# Patient Record
Sex: Male | Born: 1990 | Race: Black or African American | Hispanic: No | Marital: Single | Smoking: Never smoker
Health system: Southern US, Community
[De-identification: ages and names within clinical notes are randomized; demographics above are authoritative.]

## PROBLEM LIST (undated history)

## (undated) DIAGNOSIS — F32A Depression, unspecified: Secondary | ICD-10-CM

## (undated) DIAGNOSIS — F419 Anxiety disorder, unspecified: Secondary | ICD-10-CM

## (undated) DIAGNOSIS — F4310 Post-traumatic stress disorder, unspecified: Secondary | ICD-10-CM

## (undated) HISTORY — PX: NO PAST SURGERIES: SHX2092

---

## 2020-01-15 ENCOUNTER — Encounter (HOSPITAL_COMMUNITY): Payer: Self-pay | Admitting: *Deleted

## 2020-01-15 ENCOUNTER — Other Ambulatory Visit: Payer: Self-pay

## 2020-01-15 ENCOUNTER — Emergency Department (HOSPITAL_COMMUNITY)
Admission: EM | Admit: 2020-01-15 | Discharge: 2020-01-15 | Disposition: A | Discharge status: Home / Self Care | Payer: Medicaid - Out of State | Attending: Emergency Medicine | Admitting: Emergency Medicine

## 2020-01-15 DIAGNOSIS — Z79899 Other long term (current) drug therapy: Secondary | ICD-10-CM | POA: Diagnosis not present

## 2020-01-15 DIAGNOSIS — Z760 Encounter for issue of repeat prescription: Secondary | ICD-10-CM | POA: Diagnosis present

## 2020-01-15 HISTORY — DX: Anxiety disorder, unspecified: F41.9

## 2020-01-15 HISTORY — DX: Depression, unspecified: F32.A

## 2020-01-15 HISTORY — DX: Post-traumatic stress disorder, unspecified: F43.10

## 2020-01-15 MED ORDER — TRAZODONE HCL 50 MG PO TABS
50.0000 mg | ORAL_TABLET | Freq: Two times a day (BID) | ORAL | 0 refills | Status: DC
Start: 2020-01-15 — End: 2020-03-17

## 2020-01-15 MED ORDER — CLONAZEPAM 1 MG PO TABS
1.0000 mg | ORAL_TABLET | Freq: Every day | ORAL | 0 refills | Status: DC
Start: 2020-01-15 — End: 2020-01-15

## 2020-01-15 MED ORDER — CLONAZEPAM 1 MG PO TABS
1.0000 mg | ORAL_TABLET | Freq: Every day | ORAL | 0 refills | Status: DC
Start: 2020-01-15 — End: 2020-03-17

## 2020-01-15 MED ORDER — OLANZAPINE 15 MG PO TABS
15.0000 mg | ORAL_TABLET | Freq: Every day | ORAL | 0 refills | Status: DC
Start: 2020-01-15 — End: 2020-03-17

## 2020-01-15 MED ORDER — LITHIUM CARBONATE 300 MG PO TABS: 300.0000 mg | ORAL_TABLET | Freq: Four times a day (QID) | ORAL | 0 refills | Status: DC

## 2020-01-15 MED ORDER — ESCITALOPRAM OXALATE 10 MG PO TABS
10.0000 mg | ORAL_TABLET | Freq: Every day | ORAL | 0 refills | Status: DC
Start: 2020-01-15 — End: 2020-03-17

## 2020-01-15 NOTE — ED Triage Notes (Signed)
Pt is traveling and ran out of his prescriptions and requesting a refill. Denies any complaints.

## 2020-01-15 NOTE — ED Provider Notes (Signed)
[facility] EMERGENCY DEPARTMENT Provider Note   CSN: [id number] Arrival date & time: 01/15/20  1637     History Chief Complaint  Patient presents with   Medication Refill    Jose Stevenson is a 29 y.o. male.  HPI    Patient presents to the emergency department with chief complaint of medication refill.  Patient states he recently moved from [place] and has not a PCP or behavioral health doctor to prescribe his medications.  Patient states he has been out of medication for the last week and is coming here today to get refills on his medication.  Patient denies any complaints, denies thoughts of suicide or hurting others.  Patient has a medical history of anxiety, depression, PTSD and is requesting med refills for these chronic diseases.  Patient denies alcohol use, smoking tobacco or marijuana, drug use.  He denies headache, fever, chills, shortness of breath, chest pain, abdominal pain, leg pain.  Past Medical History:  Diagnosis Date   Anxiety    Depression    PTSD (post-traumatic stress disorder)     There are no problems to display for this patient.   History reviewed. No pertinent surgical history.     History reviewed. No pertinent family history.  Social History   Tobacco Use   Smoking status: Never Smoker  Substance Use Topics   Alcohol use: Never   Drug use: Not on file    Home Medications Prior to Admission medications   Medication Sig Start Date End Date Taking? Authorizing Provider  clonazePAM (KLONOPIN) 1 MG tablet Take 1 mg by mouth daily.   Yes [provider]  escitalopram (LEXAPRO) 10 MG tablet Take 10 mg by mouth daily.   Yes [provider]  lithium 300 MG tablet Take 300 mg by mouth 4 (four) times daily.   Yes [provider]  OLANZapine (ZYPREXA) 15 MG tablet Take 15 mg by mouth at bedtime.   Yes [provider]  traZODone (DESYREL) 50 MG tablet Take 50 mg by mouth 2 (two) times  daily.   Yes [provider]  clonazePAM (KLONOPIN) 1 MG tablet Take 1 tablet (1 mg total) by mouth daily. 01/15/20   Marcello Fennel, PA-C  escitalopram (LEXAPRO) 10 MG tablet Take 1 tablet (10 mg total) by mouth daily. 01/15/20   Marcello Fennel, PA-C  lithium 300 MG tablet Take 1 tablet (300 mg total) by mouth 4 (four) times daily. 01/15/20 02/14/20  Marcello Fennel, PA-C  OLANZapine (ZYPREXA) 15 MG tablet Take 1 tablet (15 mg total) by mouth at bedtime. 01/15/20   Marcello Fennel, PA-C  traZODone (DESYREL) 50 MG tablet Take 1 tablet (50 mg total) by mouth 2 (two) times daily. 01/15/20 02/14/20  Marcello Fennel, PA-C    Allergies    Patient has no known allergies.  Review of Systems   Review of Systems  Constitutional: Negative for chills and fever.  HENT: Negative for congestion.   Respiratory: Negative for shortness of breath.   Cardiovascular: Negative for chest pain.  Gastrointestinal: Negative for abdominal pain.  Genitourinary: Negative for enuresis.  Musculoskeletal: Negative for back pain.  Skin: Negative for rash.  Neurological: Negative for dizziness and headaches.  Hematological: Does not bruise/bleed easily.  Psychiatric/Behavioral: Negative for agitation, behavioral problems, hallucinations, self-injury and suicidal ideas. The patient is not nervous/anxious.     Physical Exam Updated Vital Signs BP (!) 141/80 (BP Location: Left Arm)    Pulse 66  Temp 98.4 F (36.9 C) (Oral)    Resp 16    Ht 6\' 1"  (1.854 m)    Wt 86.2 kg    SpO2 100%    BMI 25.07 kg/m   Physical Exam Vitals and nursing note reviewed.  Constitutional:      General: He is not in acute distress.    Appearance: Normal appearance. He is not ill-appearing or diaphoretic.  HENT:     Head: Normocephalic and atraumatic.     Nose: No congestion or rhinorrhea.  Eyes:     General: No scleral icterus.       Right eye: No discharge.        Left eye: No discharge.      Conjunctiva/sclera: Conjunctivae normal.  Cardiovascular:     Rate and Rhythm: Normal rate and regular rhythm.     Pulses: Normal pulses.     Heart sounds: No murmur heard.  No friction rub. No gallop.   Pulmonary:     Effort: Pulmonary effort is normal. No respiratory distress.     Breath sounds: Normal breath sounds. No wheezing, rhonchi or rales.  Abdominal:     General: There is no distension.     Tenderness: There is no abdominal tenderness. There is no guarding.  Musculoskeletal:        General: No swelling.     Cervical back: Neck supple.     Right lower leg: No edema.     Left lower leg: No edema.  Skin:    General: Skin is warm and dry.     Coloration: Skin is not jaundiced or pale.     Findings: No rash.  Neurological:     Mental Status: He is alert and oriented to person, place, and time.  Psychiatric:        Mood and Affect: Mood normal.        Behavior: Behavior normal.     ED Results / Procedures / Treatments   Labs (all labs ordered are listed, but only abnormal results are displayed) Labs Reviewed - No data to display  EKG None  Radiology No results found.  Procedures Procedures (including critical care time)  Medications Ordered in ED Medications - No data to display  ED Course  I have reviewed the triage vital signs and the nursing notes.  Pertinent labs & imaging results that were available during my care of the patient were reviewed by me and considered in my medical decision making (see chart for details).    MDM Rules/Calculators/A&P                          I have personally reviewed all imaging, labs and have interpreted them.  Lab work and imaging will be deferred as vital signs are reassuring, physical exam did not show significant findings.  Patient denies having any complaints at this time, no thoughts of hurting himself or others.  Patient not show any signs of withdrawal, he was not anxious, he was not shaking or having pressured  speech, nontachycardic, afebrile, skin exam did not show  diaphoresis or clammy skin.  Patient appears to be resting comfortably, showing no signs of distress.  Vital signs are reassuring, patient does not meet criteria to be admitted to the hospital.  Patient will get a refill of his medications and given information to find outpatient behavioral health counseling as well as a PCP.  Patient was discussed with attending who agrees assessment  and plan.  Patient was explained results and plan, patient verbalized understanding agreement said plan. Final Clinical Impression(s) / ED Diagnoses Final diagnoses:  Medication refill    Rx / DC Orders ED Discharge Orders         Ordered    traZODone (DESYREL) 50 MG tablet  2 times daily     Discontinue  Reprint     01/15/20 1719    lithium 300 MG tablet  4 times daily     Discontinue  Reprint     01/15/20 1719    escitalopram (LEXAPRO) 10 MG tablet  Daily     Discontinue  Reprint     01/15/20 1719    clonazePAM (KLONOPIN) 1 MG tablet  Daily,   Status:  Discontinued     Reprint     01/15/20 1719    OLANZapine (ZYPREXA) 15 MG tablet  Daily at bedtime     Discontinue  Reprint     01/15/20 1719    clonazePAM (KLONOPIN) 1 MG tablet  Daily     Discontinue  Reprint     01/15/20 1721           Carroll Sage, PA-C 01/15/20 1803    Pricilla Loveless, MD 01/20/20 1035

## 2020-01-15 NOTE — Discharge Instructions (Signed)
You were seen here for a med refill.  I have given you a 30-day supply of all your medications except for your Klonopin I have only given you 10 days for that.    I have given you contact information for community health and wellness they can help provide you with a primary care provider with little to no insurance.  I have also given you resources to find outpatient counseling who can help manage your medications.  Please call them at your earliest convenience to schedule an appointment.  I want to come back to the emergency department if you develop shortness of breath, chest pain, uncontrolled nausea, vomiting, diarrhea, thoughts of hurting yourself or others as the symptoms require further evaluation and management.

## 2020-03-13 ENCOUNTER — Other Ambulatory Visit: Payer: Self-pay

## 2020-03-13 ENCOUNTER — Ambulatory Visit (HOSPITAL_COMMUNITY)
Admission: EM | Admit: 2020-03-13 | Discharge: 2020-03-13 | Disposition: A | Discharge status: Other Acute Inpatient Hospital | Payer: No Payment, Other | Attending: Family | Admitting: Family

## 2020-03-13 ENCOUNTER — Inpatient Hospital Stay (HOSPITAL_COMMUNITY)
Admission: AD | Admit: 2020-03-13 | Discharge: 2020-03-17 | DRG: 885 | Disposition: A | Discharge status: Home / Self Care | Payer: Federal, State, Local not specified - Other | Source: Intra-hospital | Attending: Psychiatry | Admitting: Psychiatry

## 2020-03-13 DIAGNOSIS — R45851 Suicidal ideations: Secondary | ICD-10-CM | POA: Diagnosis present

## 2020-03-13 DIAGNOSIS — Z20822 Contact with and (suspected) exposure to covid-19: Secondary | ICD-10-CM | POA: Diagnosis present

## 2020-03-13 DIAGNOSIS — F411 Generalized anxiety disorder: Secondary | ICD-10-CM | POA: Diagnosis present

## 2020-03-13 DIAGNOSIS — Z79899 Other long term (current) drug therapy: Secondary | ICD-10-CM | POA: Diagnosis not present

## 2020-03-13 DIAGNOSIS — F329 Major depressive disorder, single episode, unspecified: Secondary | ICD-10-CM | POA: Diagnosis not present

## 2020-03-13 DIAGNOSIS — Z915 Personal history of self-harm: Secondary | ICD-10-CM

## 2020-03-13 DIAGNOSIS — G4700 Insomnia, unspecified: Secondary | ICD-10-CM | POA: Diagnosis present

## 2020-03-13 DIAGNOSIS — F333 Major depressive disorder, recurrent, severe with psychotic symptoms: Secondary | ICD-10-CM | POA: Diagnosis present

## 2020-03-13 DIAGNOSIS — F603 Borderline personality disorder: Secondary | ICD-10-CM | POA: Diagnosis present

## 2020-03-13 DIAGNOSIS — F1220 Cannabis dependence, uncomplicated: Secondary | ICD-10-CM | POA: Diagnosis present

## 2020-03-13 LAB — COMPREHENSIVE METABOLIC PANEL
ALT: 20 U/L (ref 0–44)
AST: 22 U/L (ref 15–41)
Albumin: 4.2 g/dL (ref 3.5–5.0)
Alkaline Phosphatase: 58 U/L (ref 38–126)
Anion gap: 10 (ref 5–15)
BUN: 13 mg/dL (ref 6–20)
CO2: 25 mmol/L (ref 22–32)
Calcium: 9.4 mg/dL (ref 8.9–10.3)
Chloride: 103 mmol/L (ref 98–111)
Creatinine, Ser: 1.17 mg/dL (ref 0.61–1.24)
GFR calc Af Amer: >60 mL/min (ref >60)
GFR calc non Af Amer: >60 mL/min (ref >60)
Glucose, Bld: 78 mg/dL (ref 70–99)
Potassium: 4.5 mmol/L (ref 3.5–5.1)
Sodium: 138 mmol/L (ref 135–145)
Total Bilirubin: 0.8 mg/dL (ref 0.3–1.2)
Total Protein: 7.7 g/dL (ref 6.5–8.1)

## 2020-03-13 LAB — POCT URINE DRUG SCREEN - MANUAL ENTRY (I-SCREEN)
POC Amphetamine UR: NOT DETECTED
POC Buprenorphine (BUP): NOT DETECTED
POC Cocaine UR: NOT DETECTED
POC Marijuana UR: POSITIVE — AB
POC Methadone UR: NOT DETECTED
POC Methamphetamine UR: NOT DETECTED
POC Morphine: NOT DETECTED
POC Oxazepam (BZO): NOT DETECTED
POC Oxycodone UR: NOT DETECTED
POC Secobarbital (BAR): NOT DETECTED

## 2020-03-13 LAB — POC SARS CORONAVIRUS 2 AG: SARS Coronavirus 2 Ag: NEGATIVE

## 2020-03-13 LAB — CBC WITH DIFFERENTIAL/PLATELET
Abs Immature Granulocytes: 0.01 10*3/uL (ref 0.00–0.07)
Basophils Absolute: 0 10*3/uL (ref 0.0–0.1)
Basophils Relative: 1 %
Eosinophils Absolute: 0.1 10*3/uL (ref 0.0–0.5)
Eosinophils Relative: 2 %
HCT: 44.6 % (ref 39.0–52.0)
Hemoglobin: 14.1 g/dL (ref 13.0–17.0)
Immature Granulocytes: 0 %
Lymphocytes Relative: 34 %
Lymphs Abs: 1.7 10*3/uL (ref 0.7–4.0)
MCH: 27.5 pg (ref 26.0–34.0)
MCHC: 31.6 g/dL (ref 30.0–36.0)
MCV: 87.1 fL (ref 80.0–100.0)
Monocytes Absolute: 0.7 10*3/uL (ref 0.1–1.0)
Monocytes Relative: 13 %
Neutro Abs: 2.6 10*3/uL (ref 1.7–7.7)
Neutrophils Relative %: 50 %
Platelets: 368 10*3/uL (ref 150–400)
RBC: 5.12 MIL/uL (ref 4.22–5.81)
RDW: 13.2 % (ref 11.5–15.5)
WBC: 5.1 10*3/uL (ref 4.0–10.5)
nRBC: 0 % (ref 0.0–0.2)

## 2020-03-13 LAB — POCT URINALYSIS DIP (DEVICE)
Bilirubin Urine: NEGATIVE
Glucose, UA: NEGATIVE mg/dL
Ketones, ur: NEGATIVE mg/dL
Leukocytes,Ua: NEGATIVE
Nitrite: NEGATIVE
Protein, ur: NEGATIVE mg/dL
Specific Gravity, Urine: >=1.03 (ref 1.005–1.030)
Urobilinogen, UA: 1 mg/dL (ref 0.0–1.0)
pH: 6.5 (ref 5.0–8.0)

## 2020-03-13 LAB — HEMOGLOBIN A1C
Hgb A1c MFr Bld: 4.7 % — ABNORMAL LOW (ref 4.8–5.6)
Mean Plasma Glucose: 88.19 mg/dL

## 2020-03-13 LAB — SARS CORONAVIRUS 2 BY RT PCR (HOSPITAL ORDER, PERFORMED IN ~~LOC~~ HOSPITAL LAB): SARS Coronavirus 2: NEGATIVE

## 2020-03-13 LAB — ETHANOL: Alcohol, Ethyl (B): <10 mg/dL (ref <10)

## 2020-03-13 LAB — LIPID PANEL
Cholesterol: 173 mg/dL (ref 0–200)
HDL: 42 mg/dL (ref >40)
LDL Cholesterol: 116 mg/dL — ABNORMAL HIGH (ref 0–99)
Total CHOL/HDL Ratio: 4.1 RATIO
Triglycerides: 77 mg/dL (ref <150)
VLDL: 15 mg/dL (ref 0–40)

## 2020-03-13 LAB — POC SARS CORONAVIRUS 2 AG -  ED: SARS Coronavirus 2 Ag: NEGATIVE

## 2020-03-13 LAB — LITHIUM LEVEL: Lithium Lvl: 0.56 mmol/L — ABNORMAL LOW (ref 0.60–1.20)

## 2020-03-13 LAB — TSH: TSH: 1.667 u[IU]/mL (ref 0.350–4.500)

## 2020-03-13 MED ORDER — TRAZODONE HCL 50 MG PO TABS
50.0000 mg | ORAL_TABLET | Freq: Every evening | ORAL | Status: DC | PRN
  Administered 2020-03-13 – 2020-03-16 (×4): 50 mg via ORAL
  Filled 2020-03-13 (×3): qty 1
  Filled 2020-03-13: qty 7
  Filled 2020-03-13: qty 1

## 2020-03-13 MED ORDER — ALUM & MAG HYDROXIDE-SIMETH 200-200-20 MG/5ML PO SUSP: 30.0000 mL | ORAL | Status: DC | PRN

## 2020-03-13 MED ORDER — ARIPIPRAZOLE 5 MG PO TABS
5.0000 mg | ORAL_TABLET | Freq: Once | ORAL | Status: AC
  Administered 2020-03-13: 5 mg via ORAL
  Filled 2020-03-13: qty 1

## 2020-03-13 MED ORDER — ACETAMINOPHEN 325 MG PO TABS: 650.0000 mg | ORAL_TABLET | Freq: Four times a day (QID) | ORAL | Status: DC | PRN

## 2020-03-13 MED ORDER — MAGNESIUM HYDROXIDE 400 MG/5ML PO SUSP: 30.0000 mL | Freq: Every day | ORAL | Status: DC | PRN

## 2020-03-13 MED ORDER — HYDROXYZINE HCL 25 MG PO TABS
25.0000 mg | ORAL_TABLET | Freq: Three times a day (TID) | ORAL | Status: DC | PRN
  Administered 2020-03-13: 25 mg via ORAL
  Filled 2020-03-13: qty 1

## 2020-03-13 NOTE — ED Notes (Signed)
Per [facility] @ [facility], pt has been accepted to bed 305-2

## 2020-03-13 NOTE — ED Notes (Signed)
Report called to RN Kuda/[facility], rm 305-2.  Librarian, academic.

## 2020-03-13 NOTE — ED Notes (Signed)
Safe Transport requested. 

## 2020-03-13 NOTE — ED Notes (Signed)
Pt belongings in locker #27  

## 2020-03-13 NOTE — ED Notes (Signed)
Pt A&O x 4, sleeping at present, no distress noted, cooperative at present.  Pending report & transfer to [facility].

## 2020-03-13 NOTE — ED Notes (Signed)
Patient is resting comfortably. 
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
## 2020-03-13 NOTE — ED Notes (Signed)
Pt is asleep on his chair bed with even and unlabored respirations. No distress or discomfort noted. Pt remains safe on the unit. Will continue to monitor.

## 2020-03-13 NOTE — ED Notes (Addendum)
Pt ambulatory, alert, and oriented during [facility] admission process. Pt denies HI, AVH and endorses SI, but verbally contracts for safety. Pt is cooperative, sad, and depressed. Education, support, reassurance, and encouragement provided, Pt's belongings in locker # 27. Pt denies any concerns at this time. Pt remains safe on the unit.

## 2020-03-14 ENCOUNTER — Encounter (HOSPITAL_COMMUNITY): Payer: Self-pay

## 2020-03-14 DIAGNOSIS — F411 Generalized anxiety disorder: Secondary | ICD-10-CM

## 2020-03-14 DIAGNOSIS — F1220 Cannabis dependence, uncomplicated: Secondary | ICD-10-CM | POA: Diagnosis present

## 2020-03-14 DIAGNOSIS — F603 Borderline personality disorder: Secondary | ICD-10-CM

## 2020-03-14 DIAGNOSIS — F333 Major depressive disorder, recurrent, severe with psychotic symptoms: Principal | ICD-10-CM

## 2020-03-14 MED ORDER — LITHIUM CARBONATE ER 300 MG PO TBCR
300.0000 mg | EXTENDED_RELEASE_TABLET | Freq: Two times a day (BID) | ORAL | Status: DC
  Administered 2020-03-14 – 2020-03-17 (×6): 300 mg via ORAL
  Filled 2020-03-14 (×6): qty 1
  Filled 2020-03-14: qty 14
  Filled 2020-03-14: qty 1
  Filled 2020-03-14: qty 14
  Filled 2020-03-14 (×3): qty 1

## 2020-03-14 MED ORDER — HYDROXYZINE HCL 25 MG PO TABS
25.0000 mg | ORAL_TABLET | Freq: Three times a day (TID) | ORAL | Status: DC | PRN
  Administered 2020-03-14 – 2020-03-17 (×3): 25 mg via ORAL
  Filled 2020-03-14: qty 10
  Filled 2020-03-14 (×2): qty 1
  Filled 2020-03-14: qty 10
  Filled 2020-03-14: qty 1

## 2020-03-14 MED ORDER — RISPERIDONE 1 MG PO TABS
1.0000 mg | ORAL_TABLET | Freq: Every day | ORAL | Status: DC
  Filled 2020-03-14: qty 1

## 2020-03-14 MED ORDER — BUSPIRONE HCL 7.5 MG PO TABS
7.5000 mg | ORAL_TABLET | Freq: Two times a day (BID) | ORAL | Status: DC
  Administered 2020-03-14 – 2020-03-17 (×6): 7.5 mg via ORAL
  Filled 2020-03-14 (×3): qty 1
  Filled 2020-03-14 (×2): qty 14
  Filled 2020-03-14 (×5): qty 1

## 2020-03-14 MED ORDER — RISPERIDONE 1 MG PO TABS
1.0000 mg | ORAL_TABLET | Freq: Every day | ORAL | Status: DC
  Administered 2020-03-14 – 2020-03-16 (×3): 1 mg via ORAL
  Filled 2020-03-14 (×3): qty 1
  Filled 2020-03-14: qty 7
  Filled 2020-03-14 (×3): qty 1

## 2020-03-14 MED ORDER — ESCITALOPRAM OXALATE 10 MG PO TABS
10.0000 mg | ORAL_TABLET | Freq: Every day | ORAL | Status: DC
  Administered 2020-03-14: 10 mg via ORAL
  Filled 2020-03-14 (×2): qty 1

## 2020-03-14 MED ORDER — ESCITALOPRAM OXALATE 5 MG PO TABS
15.0000 mg | ORAL_TABLET | Freq: Every day | ORAL | Status: DC
  Administered 2020-03-15: 15 mg via ORAL
  Filled 2020-03-14 (×2): qty 3

## 2020-03-14 NOTE — Progress Notes (Signed)
   03/14/20 0649  Vital Signs  Pulse Rate 84  BP 103/71  BP Location Left Arm  BP Method Automatic  Patient Position (if appropriate) Standing   D: Patient admits to SI, but denies HI and AVH. Patient rates anxiety 6/10 and depression 10/10. Patient isolated in his room and slept for most of this shift. Patient did go to the cafeteria for lunch. A  Support and encouragement provided Routine safety checks conducted every 15 minutes. Patient  Informed to notify staff with any concerns.   R: Safety maintained.
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
## 2020-03-14 NOTE — [facility] Group Notes (Signed)
Date:  03/14/2020 Time:  9:00am-9:45am  Group Topic/Focus: PROGRESSIVE RELAXATION. A group where deep breathing is taught and tensing and relaxation muscle groups is used. Imagery is used as well.  Pts are asked to imagine 3 pillars that hold them up when they are not able to hold themselves up.  Participation Level:  lacking  Participation Quality:  lacking  Affect:  Flat, depressed  Cognitive:  Oriented  Insight: lacking  Engagement in Group:  Was not able to state what holds him up  Modes of Intervention:  Activity, Discussion, Education, and Support  Additional Comments:  Pt came in group, states that his energy is 4/10  States he is here because of Bad thoughts. Not able to identify what supports him and holds him up, when ihe is not able to hold himself up  Dione Housekeeper 03/14/2020
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
## 2020-03-14 NOTE — Progress Notes (Signed)
   03/14/20 2110  Psych Admission Type (Psych Patients Only)  Admission Status Voluntary  Psychosocial Assessment  Patient Complaints None  Eye Contact Brief  Facial Expression Sad  Affect Anxious;Sad;Sullen  Speech Logical/coherent;Soft  Interaction Guarded  Motor Activity Other (Comment) (wnl)  Appearance/Hygiene Unremarkable  Behavior Characteristics Cooperative  Mood Pleasant  Thought Process  Coherency WDL  Content WDL  Delusions None reported or observed  Perception WDL  Hallucination None reported or observed  Judgment Poor  Confusion None  Danger to Self  Current suicidal ideation? Denies  Danger to Others  Danger to Others None reported or observed

## 2020-03-15 MED ORDER — BUPROPION HCL ER (XL) 150 MG PO TB24
150.0000 mg | ORAL_TABLET | Freq: Every day | ORAL | Status: DC
  Administered 2020-03-16 – 2020-03-17 (×2): 150 mg via ORAL
  Filled 2020-03-15 (×3): qty 1
  Filled 2020-03-15: qty 7

## 2020-03-15 NOTE — Progress Notes (Signed)
[facility] MD Progress Note  03/15/2020 11:31 AM [facility]  MRN:  [id number]  Subjective: [name] reports, "I have been having suicidal ideations & irrational thoughts for many years. But, It got worse lately. I used to be on Lithium because my mind is constantly racing. I did well on it. But since relocating to [place] from [place], [place], I have been trying to adjust. 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 The stress involved in all of these has made my symptoms flare-up. Also, I have been dealing with some sexual dysfunction, I'm wondering if any of my medicines are causing me such problems. I'm doing okay today. I slept well last night. I'm not having any suicidal thoughts today".  Objective:  [name] admitted voluntarily seeking treatment for Depression and possible Bipolar disorder. He reports today that he was staying in [place] and moved down to [place] 4 months ago. He reports previous hospitalization in a Psychiatric unit in [place] and he was diagnosed with Depression and personality disorder. He must have been diagnosed with Bipolar disorder considering that he has been on Lithium and reports that his mood is always " good" with Lithium. He has refilled his medications once in ER setting but ran out of medications. Patient reports that he feels worthless, anxious, depressed and helpless. He states that his brain keeps telling him that he is no good and will never be able to make it.  Beatrice is seen, chart reviewed. The chart findings discussed with the treatment team. He presents alert, oriented & aware of situation. He is visible the unit, attending group sessions. He reports today that he is feeling much better. Seem to think that he will benefit from Lithium carbonate as he has been on it in the past. He is also complaining of having been experiencing sexual dysfunction. He is wondering if some of his medications  were causing him this problems. A review of his medications has shown he has been on Lexapro prior to this admission. Discussed with patient that Lexapro may cause sexual dysfunction. Discussed with patient that Wellbutrin may be better for to alleviate this problems. He is willing & in agreement to try Wellbutrin XL 150 mg. Instructed patient to not be engaging in alcohol consumption while on Wellbutrin or any other psychotropic medications. He denies any hx of seizure disorders. He currently denies any SIHI, AVH, delusional thoughts or paranoia. He does not appear to be responding to any internal stimuli. Current laboratory findings reviewed. Current Lithium Level 0.56, low. Patient is in agreement to continue current plan of care as already in progress.   Principal Problem: MDD (major depressive disorder), recurrent, severe, with psychosis ([place])  Diagnosis: Principal Problem:   MDD (major depressive disorder), recurrent, severe, with psychosis ([place]) Active Problems:   Borderline personality disorder ([place])   Marijuana dependence ([place])   Generalized anxiety disorder  Total Time spent with patient: 25 minutes  Past Psychiatric History: See H&P  Past Medical History:  Past Medical History:  Diagnosis Date  . Anxiety   . Depression   . PTSD (post-traumatic stress disorder)    History reviewed. No pertinent surgical history.  Family History: History reviewed. No pertinent family history.  Family Psychiatric  History: See H&P  Social History:  Social History   Substance and Sexual Activity  Alcohol Use Never     Social History   Substance and Sexual Activity  Drug  Use Yes  . Frequency: 3.0 times per week  . Types: Marijuana    Social History   Socioeconomic History  . Marital status: Single    Spouse name: Not on file  . Number of children: Not on file  . Years of education: Not on file  . Highest education level: Not on file  Occupational History  . Not on file  Tobacco  Use  . Smoking status: Never Smoker  . Smokeless tobacco: Never Used  Substance and Sexual Activity  . Alcohol use: Never  . Drug use: Yes    Frequency: 3.0 times per week    Types: Marijuana  . Sexual activity: Yes  Other Topics Concern  . Not on file  Social History Narrative  . Not on file   Social Determinants of Health   Financial Resource Strain:   . Difficulty of Paying Living Expenses: Not on file  Food Insecurity:   . Worried About Charity fundraiser in the Last Year: Not on file  . Ran Out of Food in the Last Year: Not on file  Transportation Needs:   . Lack of Transportation (Medical): Not on file  . Lack of Transportation (Non-Medical): Not on file  Physical Activity:   . Days of Exercise per Week: Not on file  . Minutes of Exercise per Session: Not on file  Stress:   . Feeling of Stress : Not on file  Social Connections:   . Frequency of Communication with Friends and Family: Not on file  . Frequency of Social Gatherings with Friends and Family: Not on file  . Attends Religious Services: Not on file  . Active Member of Clubs or Organizations: Not on file  . Attends Archivist Meetings: Not on file  . Marital Status: Not on file   Additional Social History:   Sleep: Good  Appetite:  Good  Current Medications: Current Facility-Administered Medications  Medication Dose Route Frequency Provider Last Rate Last Admin  . busPIRone (BUSPAR) tablet 7.5 mg  7.5 mg Oral BID Charmaine Downs C, NP   7.5 mg at 03/15/20 5427  . escitalopram (LEXAPRO) tablet 15 mg  15 mg Oral Daily Onuoha, Josephine C, NP   15 mg at 03/15/20 0623  . hydrOXYzine (ATARAX/VISTARIL) tablet 25 mg  25 mg Oral TID PRN Charmaine Downs C, NP   25 mg at 03/14/20 2109  . lithium carbonate (LITHOBID) CR tablet 300 mg  300 mg Oral Q12H Onuoha, Josephine C, NP   300 mg at 03/15/20 7628  . risperiDONE (RISPERDAL) tablet 1 mg  1 mg Oral QHS Onuoha, Josephine C, NP   1 mg at 03/14/20 2109   . traZODone (DESYREL) tablet 50 mg  50 mg Oral QHS PRN Caroline Sauger, NP   50 mg at 03/14/20 2109    Lab Results:  Results for orders placed or performed during the hospital encounter of 03/13/20 (from the past 48 hour(s))  POC SARS Coronavirus 2 Ag-ED - Nasal Swab (BD Veritor Kit)     Status: None   Collection Time: 03/13/20  1:49 PM  Result Value Ref Range   SARS Coronavirus 2 Ag Negative Negative  SARS Coronavirus 2 by RT PCR (hospital order, performed in [place] hospital lab) Nasopharyngeal Nasopharyngeal Swab     Status: None   Collection Time: 03/13/20  1:49 PM   Specimen: Nasopharyngeal Swab  Result Value Ref Range   SARS Coronavirus 2 NEGATIVE NEGATIVE    Comment: (NOTE) SARS-CoV-2 target  nucleic acids are NOT DETECTED.  The SARS-CoV-2 RNA is generally detectable in upper and lower respiratory specimens during the acute phase of infection. The lowest concentration of SARS-CoV-2 viral copies this assay can detect is 250 copies / mL. A negative result does not preclude SARS-CoV-2 infection and should not be used as the sole basis for treatment or other patient management decisions.  A negative result may occur with improper specimen collection / handling, submission of specimen other than nasopharyngeal swab, presence of viral mutation(s) within the areas targeted by this assay, and inadequate number of viral copies (<250 copies / mL). A negative result must be combined with clinical observations, patient history, and epidemiological information.  Fact Sheet for Patients:   StrictlyIdeas.no  Fact Sheet for Healthcare Providers: BankingDealers.co.za  This test is not yet approved or  cleared by the [place] FDA and has been authorized for detection and/or diagnosis of SARS-CoV-2 by FDA under an Emergency Use Authorization (EUA).  This EUA will remain in effect (meaning this test can be used) for the duration of  the COVID-19 declaration under Section 564(b)(1) of the Act, 21 U.S.C. section 360bbb-3(b)(1), unless the authorization is terminated or revoked sooner.  Performed at [facility], [place] [address] [place], [place] [address]   POCT Urine Drug Screen - (ICup)     Status: Abnormal   Collection Time: 03/13/20  2:04 PM  Result Value Ref Range   POC Amphetamine UR None Detected None Detected   POC Secobarbital (BAR) None Detected None Detected   POC Buprenorphine (BUP) None Detected None Detected   POC Oxazepam (BZO) None Detected None Detected   POC Cocaine UR None Detected None Detected   POC Methamphetamine UR None Detected None Detected   POC Morphine None Detected None Detected   POC Oxycodone UR None Detected None Detected   POC Methadone UR None Detected None Detected   POC Marijuana UR Positive (A) None Detected  POC SARS Coronavirus 2 Ag     Status: None   Collection Time: 03/13/20  2:13 PM  Result Value Ref Range   SARS Coronavirus 2 Ag NEGATIVE NEGATIVE    Comment: (NOTE) SARS-CoV-2 antigen NOT DETECTED.   Negative results are presumptive.  Negative results do not preclude SARS-CoV-2 infection and should not be used as the sole basis for treatment or other patient management decisions, including infection  control decisions, particularly in the presence of clinical signs and  symptoms consistent with COVID-19, or in those who have been in contact with the virus.  Negative results must be combined with clinical observations, patient history, and epidemiological information. The expected result is Negative.  Fact Sheet for Patients: PodPark.tn  Fact Sheet for Healthcare Providers: GiftContent.is   This test is not yet approved or cleared by the [place] FDA and  has been authorized for detection and/or diagnosis of SARS-CoV-2 by FDA under an Emergency Use Authorization (EUA).  This EUA will remain in  effect (meaning this test can be used) for the duration of  the C OVID-19 declaration under Section 564(b)(1) of the Act, 21 U.S.C. section 360bbb-3(b)(1), unless the authorization is terminated or revoked sooner.    Hemoglobin A1c     Status: Abnormal   Collection Time: 03/13/20  2:30 PM  Result Value Ref Range   Hgb A1c MFr Bld 4.7 (L) 4.8 - 5.6 %    Comment: (NOTE) Pre diabetes:          5.7%-6.4%  Diabetes:              >  6.4%  Glycemic control for   <7.0% adults with diabetes    Mean Plasma Glucose 88.19 mg/dL    Comment: Performed at [facility] [address], [place], [place] [address]  TSH     Status: None   Collection Time: 03/13/20  2:30 PM  Result Value Ref Range   TSH 1.667 0.350 - 4.500 uIU/mL    Comment: Performed by a 3rd Generation assay with a functional sensitivity of <=0.01 uIU/mL. Performed at [facility], [place] [address] [place], [place] [address]   CBC with Differential/Platelet     Status: None   Collection Time: 03/13/20  2:30 PM  Result Value Ref Range   WBC 5.1 4.0 - 10.5 K/uL   RBC 5.12 4.22 - 5.81 MIL/uL   Hemoglobin 14.1 13.0 - 17.0 g/dL   HCT 44.6 39 - 52 %   MCV 87.1 80.0 - 100.0 fL   MCH 27.5 26.0 - 34.0 pg   MCHC 31.6 30.0 - 36.0 g/dL   RDW 13.2 11.5 - 15.5 %   Platelets 368 150 - 400 K/uL   nRBC 0.0 0.0 - 0.2 %   Neutrophils Relative % 50 %   Neutro Abs 2.6 1.7 - 7.7 K/uL   Lymphocytes Relative 34 %   Lymphs Abs 1.7 0.7 - 4.0 K/uL   Monocytes Relative 13 %   Monocytes Absolute 0.7 0 - 1 K/uL   Eosinophils Relative 2 %   Eosinophils Absolute 0.1 0 - 0 K/uL   Basophils Relative 1 %   Basophils Absolute 0.0 0 - 0 K/uL   Immature Granulocytes 0 %   Abs Immature Granulocytes 0.01 0.00 - 0.07 K/uL    Comment: Performed at [facility], [address], [place], [place] [address]  Comprehensive metabolic panel     Status: None   Collection Time: 03/13/20  2:30 PM  Result Value Ref Range   Sodium 138 135 - 145  mmol/L   Potassium 4.5 3.5 - 5.1 mmol/L   Chloride 103 98 - 111 mmol/L   CO2 25 22 - 32 mmol/L   Glucose, Bld 78 70 - 99 mg/dL    Comment: Glucose reference range applies only to samples taken after fasting for at least 8 hours.   BUN 13 6 - 20 mg/dL   Creatinine, Ser 1.17 0.61 - 1.24 mg/dL   Calcium 9.4 8.9 - 10.3 mg/dL   Total Protein 7.7 6.5 - 8.1 g/dL   Albumin 4.2 3.5 - 5.0 g/dL   AST 22 15 - 41 U/L   ALT 20 0 - 44 U/L   Alkaline Phosphatase 58 38 - 126 U/L   Total Bilirubin 0.8 0.3 - 1.2 mg/dL   GFR calc non Af Amer >60 >60 mL/min   GFR calc Af Amer >60 >60 mL/min   Anion gap 10 5 - 15    Comment: Performed at [facility], [place] [address], [place], [place] [address]  Ethanol     Status: None   Collection Time: 03/13/20  2:30 PM  Result Value Ref Range   Alcohol, Ethyl (B) <10 <10 mg/dL    Comment: (NOTE) Lowest detectable limit for serum alcohol is 10 mg/dL.  For medical purposes only. Performed at [facility], [place] [address], [place], [place] [address]   Lithium level     Status: Abnormal   Collection Time: 03/13/20  2:30 PM  Result Value Ref Range   Lithium Lvl  0.56 (L) 0.60 - 1.20 mmol/L    Comment: Performed at [facility], [place] [address] [place], [place] [address]  Lipid panel     Status: Abnormal   Collection Time: 03/13/20  2:30 PM  Result Value Ref Range   Cholesterol 173 0 - 200 mg/dL   Triglycerides 77 <150 mg/dL   HDL 42 >40 mg/dL   Total CHOL/HDL Ratio 4.1 RATIO   VLDL 15 0 - 40 mg/dL   LDL Cholesterol 116 (H) 0 - 99 mg/dL    Comment:        Total Cholesterol/HDL:CHD Risk Coronary Heart Disease Risk Table                     Men   Women  1/2 Average Risk   3.4   3.3  Average Risk       5.0   4.4  2 X Average Risk   9.6   7.1  3 X Average Risk  23.4   11.0        Use the calculated Patient Ratio above and the CHD Risk Table to determine the patient's CHD Risk.        ATP III CLASSIFICATION (LDL):  <100     mg/dL    Optimal  100-129  mg/dL   Near or Above                    Optimal  130-159  mg/dL   Borderline  160-189  mg/dL   High  >190     mg/dL   Very High Performed at [facility] [address] [place], [place] [address]   POCT urinalysis dip (device)     Status: Abnormal   Collection Time: 03/13/20  2:39 PM  Result Value Ref Range   Glucose, UA NEGATIVE NEGATIVE mg/dL   Bilirubin Urine NEGATIVE NEGATIVE   Ketones, ur NEGATIVE NEGATIVE mg/dL   Specific Gravity, Urine >=1.030 1.005 - 1.030   Hgb urine dipstick TRACE (A) NEGATIVE   pH 6.5 5.0 - 8.0   Protein, ur NEGATIVE NEGATIVE mg/dL   Urobilinogen, UA 1.0 0.0 - 1.0 mg/dL   Nitrite NEGATIVE NEGATIVE   Leukocytes,Ua NEGATIVE NEGATIVE    Comment: Biochemical Testing Only. Please order routine urinalysis from main lab if confirmatory testing is needed.   Blood Alcohol level:  Lab Results  Component Value Date   ETH <10 75/04/2584   Metabolic Disorder Labs: Lab Results  Component Value Date   HGBA1C 4.7 (L) 03/13/2020   MPG 88.19 03/13/2020   No results found for: PROLACTIN Lab Results  Component Value Date   CHOL 173 03/13/2020   TRIG 77 03/13/2020   HDL 42 03/13/2020   CHOLHDL 4.1 03/13/2020   VLDL 15 03/13/2020   LDLCALC 116 (H) 03/13/2020   Physical Findings: AIMS:  , ,  ,  ,    CIWA:    COWS:     Musculoskeletal: Strength & Muscle Tone: within normal limits Gait & Station: normal Patient leans: N/A  Psychiatric Specialty Exam: Physical Exam Vitals and nursing note reviewed.  HENT:     Head: Normocephalic.     Nose: Nose normal.     Mouth/Throat:     Pharynx: Oropharynx is clear.  Eyes:     Pupils: Pupils are equal, round, and reactive to light.  Cardiovascular:     Rate and Rhythm: Normal rate.     Pulses: Normal pulses.  Pulmonary:  Effort: Pulmonary effort is normal.  Genitourinary:    Comments: Deferred Musculoskeletal:        General: Normal range of motion.     Cervical back: Normal  range of motion.  Skin:    General: Skin is warm and dry.  Neurological:     Mental Status: He is alert and oriented to person, place, and time.     Review of Systems  Constitutional: Negative for chills, diaphoresis and fever.  HENT: Negative for congestion, rhinorrhea, sneezing and sore throat.   Eyes: Negative for discharge.  Respiratory: Negative for cough, chest tightness, shortness of breath and wheezing.   Cardiovascular: Negative for chest pain and palpitations.  Gastrointestinal: Negative for diarrhea, nausea and vomiting.  Endocrine: Negative for cold intolerance.  Genitourinary: Negative for difficulty urinating.  Musculoskeletal: Negative for arthralgias and myalgias.  Skin: Negative.   Allergic/Immunologic: Negative for environmental allergies and food allergies.       Allergies: NKDA  Neurological: Negative for dizziness, tremors, speech difficulty, light-headedness, numbness and headaches.  Psychiatric/Behavioral: Positive for dysphoric mood. Negative for agitation, behavioral problems, confusion, hallucinations, self-injury, sleep disturbance and suicidal ideas. The patient is not nervous/anxious and is not hyperactive.     Blood pressure 130/83, pulse 86, temperature 98.4 F (36.9 C), temperature source Oral, resp. rate 16, height '6\' 1"'  (1.854 m), weight 93 kg, SpO2 100 %.Body mass index is 27.05 kg/m.  General Appearance: Fairly Groomed  Eye Contact:  Good  Speech:  Clear and Coherent and Normal Rate  Volume:  Increased  Mood:  Depressed  Affect:  Appropriate and Congruent  Thought Process:  Coherent and Descriptions of Associations: Intact  Orientation:  Full (Time, Place, and Person)  Thought Content:  Logical, denies any hallucinations, delusions or paranoia  Suicidal Thoughts:  Denies any thoughts, plans or intent  Homicidal Thoughts:  Denies  Memory:  Immediate;   Good Recent;   Good Remote;   Good  Judgement:  Good  Insight:  Good and Fair   Psychomotor Activity:  Normal  Concentration:  Concentration: Good and Attention Span: Good  Recall:  Good  Fund of Knowledge:  Fair  Language:  Good  Akathisia:  Negative  Handed:  Right  AIMS (if indicated):     Assets:  Communication Skills Desire for Improvement Physical Health  ADL's:  Intact  Cognition:  WNL  Sleep:  Number of Hours: 8   Treatment Plan Summary: Daily contact with patient to assess and evaluate symptoms and progress in treatment and Medication management.  - Continue inpatient hospitalization. - Will continue today 03/15/2020 plan as below except where it is noted.  Depression   - Discontinued Lexapro due to complain of sexual dysfunction.   - Initiated Wellbutrin XL 150 mg po daily starting tomorrow.  Anxiety.    - Continue Buspar 7.5 mg po daily.    - Continue Vistaril 25 mg po tid prn.  Mood control.    - Continue Risperdal 1 mg po Q hs.  Insomnia.    - Continue Trazodone 50 mg po prn.  - Encourage group participation. - Discharge disposition plan in progress.  Lindell Spar, NP, PMHNP, FNP-BC 03/15/2020, 11:31 AM

## 2020-03-15 NOTE — Progress Notes (Signed)
Patient presents with sad affect but brightens on approach. Patient denies any SI, HI, AVH at present and was able to discuss childhood traumas that aids in his feelings. Patient reports feeling better, reports the meds are helping. Reports having past attempts of SI, but verbalizes safety on the unit.  Patient plan is to continue with outpatient treatment and to continue to take medications. Encouragement and support provided. Safety checks maintained. Medications given as prescribed. Pt receptive and remains safe on unit with q 15 min checks.

## 2020-03-15 NOTE — Plan of Care (Signed)
  Problem: Education: Goal: Knowledge of the prescribed therapeutic regimen will improve Outcome: Progressing   Problem: Coping: Goal: Coping ability will improve Outcome: Progressing Goal: Will verbalize feelings Outcome: Progressing   Problem: Health Behavior/Discharge Planning: Goal: Compliance with therapeutic regimen will improve Outcome: Progressing   Problem: Safety: Goal: Ability to disclose and discuss suicidal ideas will improve Outcome: Progressing

## 2020-03-15 NOTE — Progress Notes (Signed)
Recreation Therapy Notes  Date: 8.23.21 Time: 0930 Location: 300 Hall Group Room  Group Topic: Stress Management  Goal Area(s) Addresses:  Patient will identify positive stress management techniques. Patient will identify benefits of using stress management post d/c.  Intervention: Stress Management  Activity:  Meditation.  LRT played a meditation that focused on being resilient in the face of adversity.  Patients were to listen and follow along as meditation played.    Education:  Stress Management, Discharge Planning.   Education Outcome: Acknowledges Education  Clinical Observations/Feedback: Pt did not attend group activity.    Caroll Rancher, LRT/CTRS         Caroll Rancher A 03/15/2020 11:51 AM

## 2020-03-15 NOTE — Progress Notes (Signed)
   03/15/20 1200  Psych Admission Type (Psych Patients Only)  Admission Status Voluntary  Psychosocial Assessment  Patient Complaints Depression  Eye Contact Brief  Facial Expression Sad  Affect Anxious;Sad;Sullen  Speech Logical/coherent;Soft  Interaction Guarded  Motor Activity Other (Comment) (wnl)  Appearance/Hygiene Unremarkable  Behavior Characteristics Cooperative  Mood Depressed  Thought Process  Coherency WDL  Content WDL  Delusions None reported or observed  Perception WDL  Hallucination None reported or observed  Judgment Poor  Confusion None  Danger to Self  Current suicidal ideation? Denies  Danger to Others  Danger to Others None reported or observed  Dar Note: Patient presents with a flat affect and depressed mood.  Medication given as prescribed.  Patient visible in milieu with minimal interaction.  Routine safety checks maintained every 15 minutes.  Patient is safe on and off the unit.

## 2020-03-15 NOTE — Tx Team (Signed)
Interdisciplinary Treatment and Diagnostic Plan Update  03/15/2020 Time of Session: 10:10am Jose Stevenson MRN: [id number]  Principal Diagnosis: MDD (major depressive disorder), recurrent, severe, with psychosis ([place])  Secondary Diagnoses: Principal Problem:   MDD (major depressive disorder), recurrent, severe, with psychosis ([place]) Active Problems:   Borderline personality disorder ([place])   Marijuana dependence ([place])   Generalized anxiety disorder   Current Medications:  Current Facility-Administered Medications  Medication Dose Route Frequency Provider Last Rate Last Admin  . busPIRone (BUSPAR) tablet 7.5 mg  7.5 mg Oral BID Charmaine Downs C, NP   7.5 mg at 03/15/20 9024  . escitalopram (LEXAPRO) tablet 15 mg  15 mg Oral Daily Onuoha, Josephine C, NP   15 mg at 03/15/20 0973  . hydrOXYzine (ATARAX/VISTARIL) tablet 25 mg  25 mg Oral TID PRN Charmaine Downs C, NP   25 mg at 03/14/20 2109  . lithium carbonate (LITHOBID) CR tablet 300 mg  300 mg Oral Q12H Onuoha, Josephine C, NP   300 mg at 03/15/20 5329  . risperiDONE (RISPERDAL) tablet 1 mg  1 mg Oral QHS Onuoha, Josephine C, NP   1 mg at 03/14/20 2109  . traZODone (DESYREL) tablet 50 mg  50 mg Oral QHS PRN Caroline Sauger, NP   50 mg at 03/14/20 2109   PTA Medications: Medications Prior to Admission  Medication Sig Dispense Refill Last Dose  . Aspirin-Acetaminophen-Caffeine (GOODYS EXTRA STRENGTH) 520-260-32.5 MG PACK Take 1 packet by mouth every 6 (six) hours as needed.     . clonazePAM (KLONOPIN) 1 MG tablet Take 1 mg by mouth daily.     . clonazePAM (KLONOPIN) 1 MG tablet Take 1 tablet (1 mg total) by mouth daily. 10 tablet 0   . escitalopram (LEXAPRO) 10 MG tablet Take 1 tablet (10 mg total) by mouth daily. (Patient not taking: Reported on 03/13/2020) 30 tablet 0   . escitalopram (LEXAPRO) 10 MG tablet Take 10 mg by mouth daily.     Marland Kitchen lithium 300 MG tablet Take 1 tablet (300 mg total) by mouth 4 (four) times daily. 120  tablet 0   . lithium 300 MG tablet Take 300 mg by mouth 4 (four) times daily.     Marland Kitchen OLANZapine (ZYPREXA) 15 MG tablet Take 1 tablet (15 mg total) by mouth at bedtime. (Patient not taking: Reported on 03/13/2020) 30 tablet 0   . OLANZapine (ZYPREXA) 15 MG tablet Take 15 mg by mouth at bedtime.     . traZODone (DESYREL) 50 MG tablet Take 1 tablet (50 mg total) by mouth 2 (two) times daily. 60 tablet 0   . traZODone (DESYREL) 50 MG tablet Take 50 mg by mouth 2 (two) times daily.       Patient Stressors:    Patient Strengths:    Treatment Modalities: Medication Management, Group therapy, Case management,  1 to 1 session with clinician, Psychoeducation, Recreational therapy.   Physician Treatment Plan for Primary Diagnosis: MDD (major depressive disorder), recurrent, severe, with psychosis ([place]) Long Term Goal(s): Improvement in symptoms so as ready for discharge Improvement in symptoms so as ready for discharge   Short Term Goals: Ability to identify changes in lifestyle to reduce recurrence of condition will improve Ability to verbalize feelings will improve Ability to disclose and discuss suicidal ideas Ability to demonstrate self-control will improve Ability to identify and develop effective coping behaviors will improve Ability to maintain clinical measurements within normal limits will improve Compliance with prescribed medications will improve Ability to identify triggers associated with  substance abuse/mental health issues will improve Ability to identify changes in lifestyle to reduce recurrence of condition will improve Ability to verbalize feelings will improve Ability to disclose and discuss suicidal ideas Ability to demonstrate self-control will improve Ability to identify and develop effective coping behaviors will improve Ability to maintain clinical measurements within normal limits will improve Compliance with prescribed medications will improve Ability to identify  triggers associated with substance abuse/mental health issues will improve  Medication Management: Evaluate patient's response, side effects, and tolerance of medication regimen.  Therapeutic Interventions: 1 to 1 sessions, Unit Group sessions and Medication administration.  Evaluation of Outcomes: Not Met  Physician Treatment Plan for Secondary Diagnosis: Principal Problem:   MDD (major depressive disorder), recurrent, severe, with psychosis ([place]) Active Problems:   Borderline personality disorder ([place])   Marijuana dependence ([place])   Generalized anxiety disorder  Long Term Goal(s): Improvement in symptoms so as ready for discharge Improvement in symptoms so as ready for discharge   Short Term Goals: Ability to identify changes in lifestyle to reduce recurrence of condition will improve Ability to verbalize feelings will improve Ability to disclose and discuss suicidal ideas Ability to demonstrate self-control will improve Ability to identify and develop effective coping behaviors will improve Ability to maintain clinical measurements within normal limits will improve Compliance with prescribed medications will improve Ability to identify triggers associated with substance abuse/mental health issues will improve Ability to identify changes in lifestyle to reduce recurrence of condition will improve Ability to verbalize feelings will improve Ability to disclose and discuss suicidal ideas Ability to demonstrate self-control will improve Ability to identify and develop effective coping behaviors will improve Ability to maintain clinical measurements within normal limits will improve Compliance with prescribed medications will improve Ability to identify triggers associated with substance abuse/mental health issues will improve     Medication Management: Evaluate patient's response, side effects, and tolerance of medication regimen.  Therapeutic Interventions: 1 to 1 sessions, Unit  Group sessions and Medication administration.  Evaluation of Outcomes: Not Met   RN Treatment Plan for Primary Diagnosis: MDD (major depressive disorder), recurrent, severe, with psychosis ([place]) Long Term Goal(s): Knowledge of disease and therapeutic regimen to maintain health will improve  Short Term Goals: Ability to remain free from injury will improve, Ability to verbalize frustration and anger appropriately will improve, Ability to identify and develop effective coping behaviors will improve and Compliance with prescribed medications will improve  Medication Management: RN will administer medications as ordered by provider, will assess and evaluate patient's response and provide education to patient for prescribed medication. RN will report any adverse and/or side effects to prescribing provider.  Therapeutic Interventions: 1 on 1 counseling sessions, Psychoeducation, Medication administration, Evaluate responses to treatment, Monitor vital signs and CBGs as ordered, Perform/monitor CIWA, COWS, AIMS and Fall Risk screenings as ordered, Perform wound care treatments as ordered.  Evaluation of Outcomes: Not Met   LCSW Treatment Plan for Primary Diagnosis: MDD (major depressive disorder), recurrent, severe, with psychosis ([place]) Long Term Goal(s): Safe transition to appropriate next level of care at discharge, Engage patient in therapeutic group addressing interpersonal concerns.  Short Term Goals: Engage patient in aftercare planning with referrals and resources, Increase social support, Identify triggers associated with mental health/substance abuse issues and Increase skills for wellness and recovery  Therapeutic Interventions: Assess for all discharge needs, 1 to 1 time with Social worker, Explore available resources and support systems, Assess for adequacy in community support network, Educate family and significant other(s) on  suicide prevention, Complete Psychosocial Assessment,  Interpersonal group therapy.  Evaluation of Outcomes: Not Met   Progress in Treatment: Attending groups: Yes. Participating in groups: Yes. Taking medication as prescribed: Yes. Toleration medication: Yes. Family/Significant other contact made: No, will contact:  if consent is  given  Patient understands diagnosis: Yes. Discussing patient identified problems/goals with staff: Yes. Medical problems stabilized or resolved: Yes. Denies suicidal/homicidal ideation: Yes. Issues/concerns per patient self-inventory: No.   New problem(s) identified: No, Describe:  none.  New Short Term/Long Term Goal(s): medication stabilization, elimination of SI thoughts, development of comprehensive mental wellness plan.   Patient Goals:  "To fix racing thoughts and anxiety"  Discharge Plan or Barriers: Patient recently admitted. CSW will continue to follow and assess for appropriate referrals and possible discharge planning.   Reason for Continuation of Hospitalization: Anxiety Depression Medication stabilization  Estimated Length of Stay: 3-5 days  Attendees: Patient: Jose Stevenson 03/15/2020   Physician: Myles Lipps, MD 03/15/2020   Nursing:  03/15/2020   RN Care Manager: 03/15/2020   Social Worker: Darletta Moll, LCSW 03/15/2020   Recreational Therapist:  03/15/2020   Other:  03/15/2020   Other:  03/15/2020   Other: 03/15/2020       Scribe for Treatment Team: Vassie Moselle, LCSW 03/15/2020 10:46 AM

## 2020-03-15 NOTE — Progress Notes (Signed)
ADULT GRIEF GROUP NOTE:  Spiritual care group on grief and loss facilitated by chaplain Burnis Kingfisher  Group Goal:  Support / Education around grief and loss Members engage in facilitated group support and psycho-social education.  Group Description:  Following introductions and group rules, group members engaged in facilitated group dialog and support around topic of loss, with particular support around experiences of loss in their lives. Group Identified types of loss (relationships / self / things) and identified patterns, circumstances, and changes that precipitate losses. Reflected on thoughts / feelings around loss, normalized grief responses, and recognized variety in grief experience. Patient Progress:   Present at beginning of group.  Left group and did not return.
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
## 2020-03-16 NOTE — Plan of Care (Signed)
Nurse discussed anxiety, depression and coping skills with patient.  

## 2020-03-16 NOTE — Progress Notes (Signed)
Recreation Therapy Notes  Animal-Assisted Activity (AAA) Program Checklist/Progress Notes Patient Eligibility Criteria Checklist & Daily Group note for Rec Tx Intervention  Date: 8.24.21 Time: 1430 Location: 300 Hall Group Room   AAA/T Program Assumption of Risk Form signed by Patient/ or Parent Legal Guardian  YES  Patient is free of allergies or sever asthma  YES   Patient reports no fear of animals YES   Patient reports no history of cruelty to animals  YES  Patient understands his/her participation is voluntary YES  Patient washes hands before animal contact  YES   Patient washes hands after animal contact  YES   Behavioral Response:  Engaged  Education: Hand Washing, Appropriate Animal Interaction   Education Outcome: Acknowledges understanding/In group clarification offered/Needs additional education.   Clinical Observations/Feedback:  Pt attended and participated in activity.   Brittin Janik, LRT/CTRS         Delane Stalling A 03/16/2020 3:33 PM 

## 2020-03-16 NOTE — Progress Notes (Signed)
Patient stated that he takes trazadone at home 50 mg approximately one yr.  Trazadone is not helping him here.   Patient denied SI and HI, contracts for safety.  Denied A/V hallucinations.  Patient stated the other medicines he is taking are working.

## 2020-03-16 NOTE — Progress Notes (Signed)
Patient presents early in shift anxious. Reports was watching an intense movie and it was somehow a trigger for his anxiety. Prn given with good relief. Patient excited about being discharged. Denies any SI, HI,AVH. Patient noted smiling, laughing and talking with peers in milieu appropriately. Did attend evening group.  Encouragement and support provided. Safety checks maintained. Medications given as prescribed. Pt receptive and remains safe on unit with q 15 min checks.

## 2020-03-16 NOTE — Progress Notes (Signed)
Patient rated his day as a 4 out of 10 since he kept himself all day long. He did not share a goal for tomorrow .

## 2020-03-16 NOTE — Progress Notes (Signed)
D;  Patient's self inventory sheet, patient has poor sleep, no sleep medication given.  Good appetite, normal energy level, good concentration.  Rated depression 4, anxiety 5, denied hopeless.  Denied withdrawals.  Denied SI.  Denied physical problems.  Not sure of goal today.   No discharge plans. A:  Medications administered per MD orders.  Emotional support and encouragement given patient. R:  Denied SI and HI, contracts for safety.  Denied A/V hallucinations.  Safety maintained with 15 minute checks.

## 2020-03-16 NOTE — Plan of Care (Signed)
  Problem: Education: Goal: Knowledge of the prescribed therapeutic regimen will improve Outcome: Progressing   Problem: Activity: Goal: Interest or engagement in leisure activities will improve Outcome: Progressing   Problem: Coping: Goal: Coping ability will improve Outcome: Progressing Goal: Will verbalize feelings Outcome: Progressing   Problem: Health Behavior/Discharge Planning: Goal: Ability to make decisions will improve Outcome: Progressing Goal: Compliance with therapeutic regimen will improve Outcome: Progressing   

## 2020-03-16 NOTE — Progress Notes (Signed)
[facility] MD Progress Note  03/16/2020 4:43 PM Jose Stevenson  MRN:  [id number] Subjective: Patient is a 29 year old male who was admitted on 03/14/2020 seeking treatment for depression and possible bipolar disorder.  He had moved to [place] 4 months ago.  He had a previous hospitalization in [place], [place] and had been diagnosed with depression and personality disorder.  He had been on lithium and reported that it did well.  He had run out of his medications recently, and reported feeling worthless, anxious, depressed and helpless.  Objective: Patient is seen and examined.  Patient is a 29 year old male with the above-stated past psychiatric history who is seen in follow-up.  His medication changes were made yesterday, and he was started on Wellbutrin XL this a.m.  He had been on Lexapro, but suffered sexual dysfunction to that.  He stated today he feels well.  He denied any side effects to the Wellbutrin.  We discussed the possibility of the Wellbutrin making him more anxious, and interrupting his sleep.  Today is the first day so he does not know about sleep disturbance.  He denied any racing thoughts or pressured speech.  He denied any side effects so far to the Wellbutrin.  He denied any auditory or visual hallucinations.  He denied any suicidal or homicidal ideation.  His other current medications include buspirone 7.5 mg p.o. twice daily and lithium carbonate 300 mg p.o. every 12 hours.  He is also taking Risperdal 1 mg p.o. nightly.  His lithium level on 8/21 was 0.56.  TSH was 1.667.  His creatinine was 1.17.  His vital signs are stable, he is afebrile.  He slept 6.5 hours last night.  Principal Problem: MDD (major depressive disorder), recurrent, severe, with psychosis (HCC) Diagnosis: Principal Problem:   MDD (major depressive disorder), recurrent, severe, with psychosis (HCC) Active Problems:   Borderline personality disorder (HCC)   Marijuana dependence (HCC)   Generalized anxiety  disorder  Total Time spent with patient: 20 minutes  Past Psychiatric History: See admission H&P  Past Medical History:  Past Medical History:  Diagnosis Date  . Anxiety   . Depression   . PTSD (post-traumatic stress disorder)    History reviewed. No pertinent surgical history. Family History: History reviewed. No pertinent family history. Family Psychiatric  History: See admission H&P Social History:  Social History   Substance and Sexual Activity  Alcohol Use Never     Social History   Substance and Sexual Activity  Drug Use Yes  . Frequency: 3.0 times per week  . Types: Marijuana    Social History   Socioeconomic History  . Marital status: Single    Spouse name: Not on file  . Number of children: Not on file  . Years of education: Not on file  . Highest education level: Not on file  Occupational History  . Not on file  Tobacco Use  . Smoking status: Never Smoker  . Smokeless tobacco: Never Used  Substance and Sexual Activity  . Alcohol use: Never  . Drug use: Yes    Frequency: 3.0 times per week    Types: Marijuana  . Sexual activity: Yes  Other Topics Concern  . Not on file  Social History Narrative  . Not on file   Social Determinants of Health   Financial Resource Strain:   . Difficulty of Paying Living Expenses: Not on file  Food Insecurity:   . Worried About Programme researcher, broadcasting/film/video in the Last Year: Not on file  .  Ran Out of Food in the Last Year: Not on file  Transportation Needs:   . Lack of Transportation (Medical): Not on file  . Lack of Transportation (Non-Medical): Not on file  Physical Activity:   . Days of Exercise per Week: Not on file  . Minutes of Exercise per Session: Not on file  Stress:   . Feeling of Stress : Not on file  Social Connections:   . Frequency of Communication with Friends and Family: Not on file  . Frequency of Social Gatherings with Friends and Family: Not on file  . Attends Religious Services: Not on file  .  Active Member of Clubs or Organizations: Not on file  . Attends Banker Meetings: Not on file  . Marital Status: Not on file   Additional Social History:                         Sleep: Good  Appetite:  Good  Current Medications: Current Facility-Administered Medications  Medication Dose Route Frequency Provider Last Rate Last Admin  . buPROPion (WELLBUTRIN XL) 24 hr tablet 150 mg  150 mg Oral Daily Armandina Stammer I, NP   150 mg at 03/16/20 0740  . busPIRone (BUSPAR) tablet 7.5 mg  7.5 mg Oral BID Dahlia Byes C, NP   7.5 mg at 03/16/20 1622  . hydrOXYzine (ATARAX/VISTARIL) tablet 25 mg  25 mg Oral TID PRN Dahlia Byes C, NP   25 mg at 03/14/20 2109  . lithium carbonate (LITHOBID) CR tablet 300 mg  300 mg Oral Q12H Onuoha, Josephine C, NP   300 mg at 03/16/20 0740  . risperiDONE (RISPERDAL) tablet 1 mg  1 mg Oral QHS Onuoha, Josephine C, NP   1 mg at 03/15/20 2041  . traZODone (DESYREL) tablet 50 mg  50 mg Oral QHS PRN Gillermo Murdoch, NP   50 mg at 03/15/20 2041    Lab Results: No results found for this or any previous visit (from the past 48 hour(s)).  Blood Alcohol level:  Lab Results  Component Value Date   ETH <10 03/13/2020    Metabolic Disorder Labs: Lab Results  Component Value Date   HGBA1C 4.7 (L) 03/13/2020   MPG 88.19 03/13/2020   No results found for: PROLACTIN Lab Results  Component Value Date   CHOL 173 03/13/2020   TRIG 77 03/13/2020   HDL 42 03/13/2020   CHOLHDL 4.1 03/13/2020   VLDL 15 03/13/2020   LDLCALC 116 (H) 03/13/2020    Physical Findings: AIMS:  , ,  ,  ,    CIWA:    COWS:     Musculoskeletal: Strength & Muscle Tone: within normal limits Gait & Station: normal Patient leans: N/A  Psychiatric Specialty Exam: Physical Exam Vitals and nursing note reviewed.  Constitutional:      Appearance: Normal appearance.  HENT:     Head: Normocephalic and atraumatic.  Pulmonary:     Effort: Pulmonary effort  is normal.  Neurological:     General: No focal deficit present.     Mental Status: He is alert and oriented to person, place, and time.     Review of Systems  Blood pressure 121/73, pulse 89, temperature 98.2 F (36.8 C), temperature source Oral, resp. rate 18, height 6\' 1"  (1.854 m), weight 93 kg, SpO2 100 %.Body mass index is 27.05 kg/m.  General Appearance: Casual  Eye Contact:  Good  Speech:  Normal Rate  Volume:  Normal  Mood:  Euthymic  Affect:  Congruent  Thought Process:  Coherent and Descriptions of Associations: Intact  Orientation:  Full (Time, Place, and Person)  Thought Content:  Logical  Suicidal Thoughts:  No  Homicidal Thoughts:  No  Memory:  Immediate;   Good Recent;   Good Remote;   Good  Judgement:  Intact  Insight:  Good  Psychomotor Activity:  Normal  Concentration:  Concentration: Good and Attention Span: Good  Recall:  Good  Fund of Knowledge:  Good  Language:  Good  Akathisia:  Negative  Handed:  Right  AIMS (if indicated):     Assets:  Desire for Improvement Financial Resources/Insurance Housing Resilience Talents/Skills Transportation Vocational/Educational  ADL's:  Intact  Cognition:  WNL  Sleep:  Number of Hours: 6.5     Treatment Plan Summary: Daily contact with patient to assess and evaluate symptoms and progress in treatment, Medication management and Plan : Patient is seen and examined.  Patient is a 29 year old male with the above-stated past psychiatric history who is seen in follow-up.   Diagnosis:  1.  Bipolar disorder, most recently depressed  2.  Generalized anxiety disorder.  3.  Reported borderline personality disorder  Pertinent findings on examination today:  1.  Patient is tolerating the switch from Lexapro to Wellbutrin without difficulty.  2.  Patient reports his mood to be improving from admission.  3.  No suicidal or homicidal ideation.  4.  No psychotic symptoms.  5.  No side effects to medications so  far.  6.  Discussed possible side effects to Wellbutrin XL.  Plan:  1.  Continue Wellbutrin XL 150 mg p.o. daily for depression and anxiety.  2.  Continue buspirone 7.5 mg p.o. twice daily for anxiety.  3.  Continue hydroxyzine 25 mg p.o. 3 times daily as needed anxiety.  4.  Continue lithium carbonate CR 300 mg p.o. every 12 hours for mood stability.  5.  Continue Risperdal 1 mg p.o. nightly for mood stability.  6.  Continue trazodone 50 mg p.o. nightly as needed insomnia.  7.  Obtain lithium level, TSH and renal panel in a.m. tomorrow.  8.  If all goes well overnight we will plan on discharging the patient tomorrow morning.  Antonieta Pert, MD 03/16/2020, 4:43 PM

## 2020-03-17 LAB — TSH: TSH: 2.732 u[IU]/mL (ref 0.350–4.500)

## 2020-03-17 LAB — BASIC METABOLIC PANEL
Anion gap: 12 (ref 5–15)
BUN: 14 mg/dL (ref 6–20)
CO2: 24 mmol/L (ref 22–32)
Calcium: 9.7 mg/dL (ref 8.9–10.3)
Chloride: 102 mmol/L (ref 98–111)
Creatinine, Ser: 1.33 mg/dL — ABNORMAL HIGH (ref 0.61–1.24)
GFR calc Af Amer: >60 mL/min (ref >60)
GFR calc non Af Amer: >60 mL/min (ref >60)
Glucose, Bld: 84 mg/dL (ref 70–99)
Potassium: 4.5 mmol/L (ref 3.5–5.1)
Sodium: 138 mmol/L (ref 135–145)

## 2020-03-17 LAB — LITHIUM LEVEL: Lithium Lvl: 0.44 mmol/L — ABNORMAL LOW (ref 0.60–1.20)

## 2020-03-17 MED ORDER — TRAZODONE HCL 50 MG PO TABS
50.0000 mg | ORAL_TABLET | Freq: Every evening | ORAL | 0 refills | Status: DC | PRN
Start: 2020-03-17 — End: 2020-04-08

## 2020-03-17 MED ORDER — BUPROPION HCL ER (XL) 150 MG PO TB24
150.0000 mg | ORAL_TABLET | Freq: Every day | ORAL | 0 refills | Status: DC
Start: 2020-03-18 — End: 2020-04-08

## 2020-03-17 MED ORDER — LITHIUM CARBONATE ER 300 MG PO TBCR: 300.0000 mg | EXTENDED_RELEASE_TABLET | Freq: Two times a day (BID) | ORAL | 0 refills | Status: DC

## 2020-03-17 MED ORDER — HYDROXYZINE HCL 25 MG PO TABS
25.0000 mg | ORAL_TABLET | Freq: Three times a day (TID) | ORAL | 0 refills | Status: DC | PRN
Start: 2020-03-17 — End: 2020-04-08

## 2020-03-17 MED ORDER — BUSPIRONE HCL 7.5 MG PO TABS
7.5000 mg | ORAL_TABLET | Freq: Two times a day (BID) | ORAL | 0 refills | Status: DC
Start: 2020-03-17 — End: 2020-04-08

## 2020-03-17 MED ORDER — RISPERIDONE 1 MG PO TABS: 1.0000 mg | ORAL_TABLET | Freq: Every day | ORAL | 0 refills | Status: DC

## 2020-03-17 NOTE — Progress Notes (Signed)
  [facility] Adult Case Management Discharge Plan :  Will you be returning to the same living situation after discharge:  Yes,  lives alone At discharge, do you have transportation home?: Yes,  pt will take safe transport to [facility] to pick up his vehicle Do you have the ability to pay for your medications: No.  Release of information consent forms completed and in the chart;  Patient's signature needed at discharge.  Patient to Follow up at:  Follow-up Information    [facility]. Go on 04/08/2020.   Specialty: Behavioral Health Why: You have a Walk In appointment for therapy on 03/25/20 at 8:00 am. You also have a Walk In appointment for medication management on 04/08/20 at 8:00 am. These appointments will be held in person.  Please arrive 15 minutes prior to your appointment. Contact information: [address] [place] [address] [phone]              Next level of care provider has access to [facility] Link:yes  Safety Planning and Suicide Prevention discussed: Yes,  with pt     Has patient been referred to the Quitline?: N/A patient is not a smoker  Patient has been referred for addiction treatment: N/A  Suzan Slick, LCSW 03/17/2020, 10:39 AM
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
## 2020-03-17 NOTE — [facility] Suicide Risk Assessment (Signed)
[facility] Discharge Suicide Risk Assessment   Principal Problem: MDD (major depressive disorder), recurrent, severe, with psychosis (HCC) Discharge Diagnoses: Principal Problem:   MDD (major depressive disorder), recurrent, severe, with psychosis (HCC) Active Problems:   Borderline personality disorder (HCC)   Marijuana dependence (HCC)   Generalized anxiety disorder   Total Time spent with patient: 15 minutes  Musculoskeletal: Strength & Muscle Tone: within normal limits Gait & Station: normal Patient leans: N/A  Psychiatric Specialty Exam: Review of Systems  All other systems reviewed and are negative.   Blood pressure 110/77, pulse 91, temperature 98.4 F (36.9 C), temperature source Oral, resp. rate 18, height 6\' 1"  (1.854 m), weight 93 kg, SpO2 100 %.Body mass index is 27.05 kg/m.  General Appearance: Casual  Eye Contact::  Good  Speech:  Normal Rate409  Volume:  Normal  Mood:  Euthymic  Affect:  Congruent  Thought Process:  Coherent and Descriptions of Associations: Intact  Orientation:  Full (Time, Place, and Person)  Thought Content:  Logical  Suicidal Thoughts:  No  Homicidal Thoughts:  No  Memory:  Immediate;   Good Recent;   Good Remote;   Good  Judgement:  Good  Insight:  Good  Psychomotor Activity:  Normal  Concentration:  Good  Recall:  Good  Fund of Knowledge:Good  Language: Good  Akathisia:  Negative  Handed:  Right  AIMS (if indicated):     Assets:  Communication Skills Desire for Improvement Financial Resources/Insurance Housing Resilience Social Support Talents/Skills Transportation Vocational/Educational  Sleep:  Number of Hours: 6.75  Cognition: WNL  ADL's:  Intact   Mental Status Per Nursing Assessment::   On Admission:  Suicidal ideation indicated by patient  Demographic Factors:  Male and [phone], lesbian, or bisexual orientation  Loss Factors: NA  Historical Factors: Impulsivity  Risk Reduction Factors:   Employed, Living with  another person, especially a relative, Positive social support and Positive coping skills or problem solving skills  Continued Clinical Symptoms:  Bipolar Disorder:   Depressive phase  Cognitive Features That Contribute To Risk:  None    Suicide Risk:  Minimal: No identifiable suicidal ideation.  Patients presenting with no risk factors but with morbid ruminations; may be classified as minimal risk based on the severity of the depressive symptoms   Follow-up Information    [facility] Go on 04/08/2020.   Specialty: Behavioral Health Why: You have a Walk In appointment for therapy on 03/25/20 at 8:00 am. You also have a Walk In appointment for medication management on 04/08/20 at 8:00 am. These appointments will be held in person.  Please arrive 15 minutes prior to your appointment. Contact information: [address] [place] [phone]              Plan Of Care/Follow-up recommendations:  Activity:  ad lib  [phone], MD 03/17/2020, 7:44 AM

## 2020-03-17 NOTE — Progress Notes (Addendum)
RN met with pt and reviewed pt's discharge instructions.  Pt verbalized understanding of discharge instructions and pt did not have any questions. RN reviewed and provided pt with a copy of SRA, AVS and Transition Record.  RN returned pt's belongings to pt.  Prescriptions and samples were given to pt.  Pt denied SI/HI/AVH and voiced no concerns.  Pt was appreciative of the care pt received at [facility].  Patient discharged to the lobby without incident.

## 2020-03-23 ENCOUNTER — Telehealth (HOSPITAL_COMMUNITY): Payer: Self-pay | Admitting: *Deleted

## 2020-03-23 NOTE — Telephone Encounter (Signed)
Call re the expense of his RXs. He has just been discharged from [facility] on 8/25. He took his RXs to [facility] and it was over 400.00 which he cant afford. He is uninsured. Discussed with him going to [facility] it would be more affordable and they would help if any of his medications are eligible for PAP. At least three of his medications are on their free list. He was given the number and address to the center to call and check on cost if its better and he can afford it then to ask [facility] to transfer his RXs to them from [facility]. If needed to call me back.

## 2020-03-25 ENCOUNTER — Ambulatory Visit (INDEPENDENT_AMBULATORY_CARE_PROVIDER_SITE_OTHER): Payer: No Payment, Other | Admitting: Licensed Clinical Social Worker

## 2020-03-25 ENCOUNTER — Other Ambulatory Visit: Payer: Self-pay

## 2020-03-25 DIAGNOSIS — F411 Generalized anxiety disorder: Secondary | ICD-10-CM | POA: Diagnosis not present

## 2020-03-25 DIAGNOSIS — F3162 Bipolar disorder, current episode mixed, moderate: Secondary | ICD-10-CM

## 2020-03-25 DIAGNOSIS — F603 Borderline personality disorder: Secondary | ICD-10-CM
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
## 2020-04-08 ENCOUNTER — Other Ambulatory Visit: Payer: Self-pay

## 2020-04-08 ENCOUNTER — Ambulatory Visit (INDEPENDENT_AMBULATORY_CARE_PROVIDER_SITE_OTHER): Payer: No Payment, Other | Admitting: Psychiatry

## 2020-04-08 ENCOUNTER — Encounter (HOSPITAL_COMMUNITY): Payer: Self-pay | Admitting: Psychiatry

## 2020-04-08 ENCOUNTER — Other Ambulatory Visit (HOSPITAL_COMMUNITY): Payer: Self-pay | Admitting: Psychiatry

## 2020-04-08 DIAGNOSIS — F603 Borderline personality disorder: Secondary | ICD-10-CM

## 2020-04-08 DIAGNOSIS — F333 Major depressive disorder, recurrent, severe with psychotic symptoms: Secondary | ICD-10-CM

## 2020-04-08 DIAGNOSIS — F411 Generalized anxiety disorder: Secondary | ICD-10-CM | POA: Diagnosis not present

## 2020-04-08 MED ORDER — RISPERIDONE 1 MG PO TABS: 1.0000 mg | ORAL_TABLET | Freq: Every day | ORAL | 2 refills | Status: DC

## 2020-04-08 MED ORDER — TRAZODONE HCL 50 MG PO TABS: 50.0000 mg | ORAL_TABLET | Freq: Every evening | ORAL | 2 refills | Status: DC | PRN

## 2020-04-08 MED ORDER — BUPROPION HCL ER (XL) 300 MG PO TB24: 300.0000 mg | ORAL_TABLET | Freq: Every day | ORAL | 2 refills | Status: DC

## 2020-04-08 MED ORDER — HYDROXYZINE HCL 25 MG PO TABS: 25.0000 mg | ORAL_TABLET | Freq: Three times a day (TID) | ORAL | 2 refills | Status: DC | PRN

## 2020-04-08 MED ORDER — LITHIUM CARBONATE ER 450 MG PO TBCR: 450.0000 mg | EXTENDED_RELEASE_TABLET | Freq: Two times a day (BID) | ORAL | 2 refills | Status: DC

## 2020-04-08 MED ORDER — BUSPIRONE HCL 10 MG PO TABS: 10.0000 mg | ORAL_TABLET | Freq: Three times a day (TID) | ORAL | 2 refills | Status: DC

## 2020-04-08 MED FILL — traZODone HCL 50 MG TABS: 50 | 30 days supply | Qty: 30 | Fill #0

## 2020-04-08 MED FILL — risperiDONE 1 MG TABS: 1 | 30 days supply | Qty: 30 | Fill #0

## 2020-04-08 MED FILL — hydrOXYzine HCL 25 MG TABS: 25 | 30 days supply | Qty: 90 | Fill #0

## 2020-04-08 MED FILL — busPIRone HCL 10 MG TABS: 10 | 30 days supply | Qty: 90 | Fill #0

## 2020-04-08 MED FILL — BUPROPION HCL ER (XL) 300 M: 300 | 30 days supply | Qty: 30 | Fill #0

## 2020-04-08 MED FILL — LITHIUM ER 450 MG TABLET: 450 | 30 days supply | Qty: 60 | Fill #0
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
## 2020-05-14 MED FILL — BUPROPION HCL ER (XL) 300 M: 300 | 30 days supply | Qty: 30 | Fill #1

## 2020-05-14 MED FILL — traZODone HCL 50 MG TABS: 50 | 30 days supply | Qty: 30 | Fill #1

## 2020-05-14 MED FILL — hydrOXYzine HCL 25 MG TABS: 25 | 30 days supply | Qty: 90 | Fill #1

## 2020-05-14 MED FILL — LITHIUM ER 450 MG TABLET: 450 | 30 days supply | Qty: 60 | Fill #1

## 2020-05-14 MED FILL — risperiDONE 1 MG TABS: 1 | 30 days supply | Qty: 30 | Fill #1

## 2020-05-14 MED FILL — busPIRone HCL 10 MG TABS: 10 | 30 days supply | Qty: 90 | Fill #1

## 2020-05-17 ENCOUNTER — Ambulatory Visit (HOSPITAL_COMMUNITY): Payer: No Payment, Other | Admitting: Licensed Clinical Social Worker

## 2020-05-28 ENCOUNTER — Telehealth (HOSPITAL_COMMUNITY): Payer: Self-pay | Admitting: Licensed Clinical Social Worker

## 2020-05-31 ENCOUNTER — Ambulatory Visit (HOSPITAL_COMMUNITY): Payer: Self-pay | Admitting: Licensed Clinical Social Worker

## 2020-05-31 NOTE — Telephone Encounter (Signed)
Did this pt state if he was going to attend f/u appointment today?

## 2020-06-10 ENCOUNTER — Ambulatory Visit (INDEPENDENT_AMBULATORY_CARE_PROVIDER_SITE_OTHER): Payer: No Payment, Other | Admitting: Psychiatry

## 2020-06-10 ENCOUNTER — Other Ambulatory Visit: Payer: Self-pay

## 2020-06-10 ENCOUNTER — Encounter (HOSPITAL_COMMUNITY): Payer: Self-pay | Admitting: Psychiatry

## 2020-06-10 ENCOUNTER — Other Ambulatory Visit (HOSPITAL_COMMUNITY): Payer: Self-pay | Admitting: Psychiatry

## 2020-06-10 DIAGNOSIS — F333 Major depressive disorder, recurrent, severe with psychotic symptoms: Secondary | ICD-10-CM

## 2020-06-10 DIAGNOSIS — F603 Borderline personality disorder: Secondary | ICD-10-CM | POA: Diagnosis not present

## 2020-06-10 DIAGNOSIS — F411 Generalized anxiety disorder: Secondary | ICD-10-CM | POA: Diagnosis not present

## 2020-06-10 MED ORDER — BUPROPION HCL ER (XL) 300 MG PO TB24: 300.0000 mg | ORAL_TABLET | Freq: Every day | ORAL | 2 refills | Status: DC

## 2020-06-10 MED ORDER — LITHIUM CARBONATE ER 450 MG PO TBCR: 450.0000 mg | EXTENDED_RELEASE_TABLET | Freq: Two times a day (BID) | ORAL | 2 refills | Status: DC

## 2020-06-10 MED ORDER — HYDROXYZINE HCL 25 MG PO TABS: 25.0000 mg | ORAL_TABLET | Freq: Three times a day (TID) | ORAL | 2 refills | Status: DC | PRN

## 2020-06-10 MED ORDER — QUETIAPINE FUMARATE 25 MG PO TABS: ORAL_TABLET | ORAL | 2 refills | Status: DC

## 2020-06-10 MED ORDER — BUSPIRONE HCL 10 MG PO TABS: 10.0000 mg | ORAL_TABLET | Freq: Three times a day (TID) | ORAL | 2 refills | Status: DC

## 2020-06-10 NOTE — Progress Notes (Signed)
BH MD/PA/NP OP Progress Note  06/10/2020 1:59 PM Jose Stevenson  MRN:  [id number]  Chief Complaint: My anxiety is out of control Chief Complaint    Medication Management      HPI: 29 year old male seen today for follow up psychiatric evaluation.   He has a psychiatric history of anxiety, depression, borderline personality, and marijuana dependence.  He is currently managed on lithium 450 mg twice daily, Risperdal 1 mg nightly, trazodone 50 mg nightly, BuSpar 10 mg three daily, hydroxyzine 25 mg 3 times daily, and Wellbutrin 300 mg daily.  He notes that these medications are effective in managing his depression and mood but not his anxiety.  Today, patient is well-groomed, cooperative, pleasant, engaged in conversation and maintained good eye contact throughout the evaluation.  The patient was noted to be somewhat restless during the evaluation.  The patient reports ongoing anxiety and depressive symptoms today.  However, the patient reports that his anxiety is out of control.   He endorses anxiety symptoms including sweaty palms, nervousness, racing thoughts, excessive worrying, irritability, feeling on edge, being restless or fidgety, having panic attacks, and always feeling like something bad will happen.  He also reports that he has panic attacks about once a week.  A GAD-7 screening was conducted today and the patient scored a 19.  Patient reports that Buspar has not been effective in managing his anxiety symptoms.  He reports that since his last appointment, he started back taking an old prescription of Klonopin 0.5 mg as needed for panic attacks.  He reports that this medication has been helpful recently.  Writer discussed in depth with the patient about short-term versus long-term anxiety treatment options.  At this time, the patient reports that he would prefer to start a long-term treatment option to manage his anxiety symptoms and panic attacks.  Writer offered to start Seroquel 25 mg at  bedtime and 25 mg twice daily as needed to decrease anxiety symptoms.  Patient is agreeable to start Seroquel medication at this time.   Patient endorses depressive symptoms including fatigue, low energy, feeling bad about himself, poor concentration, and restlessness.  Patient denies any need to adjust his depression medications at this time.  Patient denies any SI/HI/AVH. Patient endorses adequate sleep of about 7 hours every night.   Patient noted that since last visit things have been improving.  He reports that he has started a new relationship, is moving, and is talking more to his parents.  He notes that he and his new partner will be furniture shopping later on today.  He also informed provider that things are going well at work.  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  Patient is agreeable to discontinue Risperdal 1 mg nightly and start Seroquel 25 mg at night and 25 mg twice daily as needed for anxiety.  Potential side effects of medication and risks vs benefits of treatment vs non-treatment were explained and discussed. All questions were answered.He is agreeable to continue all other medications as prescribed.  No other concerns noted at this time.    Visit Diagnosis:    ICD-10-CM   1. MDD (major depressive disorder), recurrent, severe, with psychosis (HCC)  F33.3 buPROPion (WELLBUTRIN XL) 300 MG 24 hr tablet  2. Generalized anxiety disorder  F41.1 busPIRone (BUSPAR) 10 MG tablet    hydrOXYzine (ATARAX/VISTARIL) 25 MG tablet    QUEtiapine (SEROQUEL) 25 MG tablet  3. Borderline personality disorder (HCC)  F60.3 lithium  carbonate (ESKALITH) 450 MG CR tablet    Past Psychiatric History: Anxiety, depression, borderline personality, and marijuana dependence.  Past Medical History:  Past Medical History:  Diagnosis Date  . Anxiety   . Depression   . PTSD (post-traumatic stress disorder)    No past surgical history on file.  Family  Psychiatric History: Biological mother bipolar, Biological father bipolar disorder, paternal grandmother bipolar  Family History: No family history on file.  Social History:  Social History   Socioeconomic History  . Marital status: Single    Spouse name: Not on file  . Number of children: Not on file  . Years of education: Not on file  . Highest education level: Not on file  Occupational History  . Not on file  Tobacco Use  . Smoking status: Never Smoker  . Smokeless tobacco: Never Used  Substance and Sexual Activity  . Alcohol use: Never  . Drug use: Yes    Frequency: 3.0 times per week    Types: Marijuana  . Sexual activity: Yes  Other Topics Concern  . Not on file  Social History Narrative  . Not on file   Social Determinants of Health   Financial Resource Strain: Low Risk   . Difficulty of Paying Living Expenses: Not hard at all  Food Insecurity: No Food Insecurity  . Worried About Programme researcher, broadcasting/film/video in the Last Year: Never true  . Ran Out of Food in the Last Year: Never true  Transportation Needs: No Transportation Needs  . Lack of Transportation (Medical): No  . Lack of Transportation (Non-Medical): No  Physical Activity: Sufficiently Active  . Days of Exercise per Week: 4 days  . Minutes of Exercise per Session: 60 min  Stress: Stress Concern Present  . Feeling of Stress : Very much  Social Connections: Socially Isolated  . Frequency of Communication with Friends and Family: More than three times a week  . Frequency of Social Gatherings with Friends and Family: Once a week  . Attends Religious Services: Never  . Active Member of Clubs or Organizations: No  . Attends Banker Meetings: Never  . Marital Status: Never married    Allergies: No Known Allergies  Metabolic Disorder Labs: Lab Results  Component Value Date   HGBA1C 4.7 (L) 03/13/2020   MPG 88.19 03/13/2020   No results found for: PROLACTIN Lab Results  Component Value Date    CHOL 173 03/13/2020   TRIG 77 03/13/2020   HDL 42 03/13/2020   CHOLHDL 4.1 03/13/2020   VLDL 15 03/13/2020   LDLCALC 116 (H) 03/13/2020   Lab Results  Component Value Date   TSH 2.732 03/17/2020   TSH 1.667 03/13/2020    Therapeutic Level Labs: Lab Results  Component Value Date   LITHIUM 0.44 (L) 03/17/2020   LITHIUM 0.56 (L) 03/13/2020   No results found for: VALPROATE No components found for:  CBMZ  Current Medications: Current Outpatient Medications  Medication Sig Dispense Refill  . buPROPion (WELLBUTRIN XL) 300 MG 24 hr tablet Take 1 tablet (300 mg total) by mouth daily. For depression 30 tablet 2  . busPIRone (BUSPAR) 10 MG tablet Take 1 tablet (10 mg total) by mouth 3 (three) times daily. For anxiety 90 tablet 2  . hydrOXYzine (ATARAX/VISTARIL) 25 MG tablet Take 1 tablet (25 mg total) by mouth 3 (three) times daily as needed for anxiety. 90 tablet 2  . lithium carbonate (ESKALITH) 450 MG CR tablet Take 1 tablet (450 mg total)  by mouth 2 (two) times daily. For mood stabilization 60 tablet 2  . QUEtiapine (SEROQUEL) 25 MG tablet Take 1 tablet (25 mg total) by mouth at bedtime. May also take 1 tablet (25 mg total) 2 (two) times daily as needed. 90 tablet 2  . traZODone (DESYREL) 50 MG tablet Take 1 tablet (50 mg total) by mouth at bedtime as needed for sleep. 30 tablet 2   No current facility-administered medications for this visit.     Musculoskeletal: Strength & Muscle Tone: within normal limits Gait & Station: normal Patient leans: N/A  Psychiatric Specialty Exam: Review of Systems  Blood pressure (!) 145/80, pulse 68, height 6\' 1"  (1.854 m), weight 188 lb (85.3 kg), SpO2 100 %.Body mass index is 24.8 kg/m.  General Appearance: Well Groomed  Eye Contact:  Good  Speech:  Clear and Coherent and Normal Rate  Volume:  Normal  Mood:  Anxious and Depressed  Affect:  Appropriate and Congruent  Thought Process:  Coherent, Goal Directed and Linear  Orientation:  Full  (Time, Place, and Person)  Thought Content: WDL and Logical   Suicidal Thoughts:  No  Homicidal Thoughts:  No  Memory:  Immediate;   Good Recent;   Good Remote;   Good  Judgement:  Good  Insight:  Good  Psychomotor Activity:  Restlessness  Concentration:  Concentration: Good and Attention Span: Good  Recall:  Good  Fund of Knowledge: Good  Language: Good  Akathisia:  No  Handed:  Right  AIMS (if indicated): Not done  Assets:  Communication Skills Desire for Improvement Financial Resources/Insurance Housing Intimacy Social Support  ADL's:  Intact  Cognition: WNL  Sleep:  Good   Screenings: AUDIT     [facility]  Alcohol Use Disorder Identification Test Final Score (AUDIT) 3    GAD-7     [facility]  Total GAD-7 Score 19    PHQ2-9     [facility]  PHQ-2 Total Score 2 3  PHQ-9 Total Score 14 13       Assessment and Plan: Patient endorses symptoms of anxiety and depression.  He is agreeable to discontinuing Risperdal 1 mg and starting Seroquel 25 mg nightly and 25 mg twice daily as needed for anxiety.  He will continue all other medications as prescribed.  1. MDD (major depressive disorder), recurrent, severe, with psychosis (HCC)  Continue- buPROPion (WELLBUTRIN XL) 300 MG 24 hr tablet; Take 1 tablet (300 mg total) by mouth daily. For depression  Dispense: 30 tablet; Refill: 2  2. Generalized anxiety disorder  ContinueContinue- busPIRone (BUSPAR) 10 MG tablet; Take 1 tablet (10 mg total) by mouth 3 (three) times daily. For anxiety  Dispense: 90 tablet; Refill: 2 Continue- hydrOXYzine (ATARAX/VISTARIL) 25 MG tablet; Take 1 tablet (25 mg total) by mouth 3 (three) times daily as needed for anxiety.   Dispense: 90 tablet; Refill: 2 Start- QUEtiapine (SEROQUEL) 25 MG tablet; Take 1 tablet (25 mg total) by mouth at bedtime. May also take 1 tablet (25 mg total) 2 (two) times daily as needed.  Dispense: 90 tablet; Refill: 2  3. Borderline personality disorder (HCC)  Continue- lithium carbonate (ESKALITH) 450 MG CR tablet; Take 1 tablet (450 mg total) by mouth 2 (two) times daily. For mood stabilization  Dispense: 60 tablet; Refill: 2  Follow-up in 1 month Follow-up with therapy  Shanna Cisco, NP 06/10/2020, 1:59 PM

## 2020-06-11 MED FILL — hydrOXYzine HCL 25 MG TABS: 25 | 30 days supply | Qty: 90 | Fill #0

## 2020-06-11 MED FILL — BUPROPION HCL ER (XL) 300 M: 300 | 30 days supply | Qty: 30 | Fill #0

## 2020-06-11 MED FILL — QUETIAPINE FUMARATE 25 MG T: 25 | 30 days supply | Qty: 60 | Fill #0

## 2020-06-11 MED FILL — busPIRone HCL 10 MG TABS: 10 | 30 days supply | Qty: 90 | Fill #0

## 2020-06-11 MED FILL — LITHIUM ER 450 MG TABLET: 450 | 30 days supply | Qty: 60 | Fill #0

## 2020-06-14 ENCOUNTER — Ambulatory Visit (HOSPITAL_COMMUNITY): Payer: Self-pay | Admitting: Licensed Clinical Social Worker

## 2020-07-14 ENCOUNTER — Encounter (HOSPITAL_COMMUNITY): Payer: No Payment, Other | Admitting: Psychiatry

## 2020-08-18 ENCOUNTER — Encounter (HOSPITAL_COMMUNITY): Payer: No Payment, Other | Admitting: Psychiatry

## 2020-09-03 MED FILL — QUETIAPINE FUMARATE 25 MG T: 25 | 30 days supply | Qty: 60 | Fill #1

## 2020-09-03 MED FILL — hydrOXYzine HCL 25 MG TABS: 25 | 30 days supply | Qty: 90 | Fill #1

## 2020-09-03 MED FILL — BUPROPION HCL ER (XL) 300 M: 300 | 30 days supply | Qty: 30 | Fill #1

## 2020-09-03 MED FILL — busPIRone HCL 10 MG TABS: 10 | 30 days supply | Qty: 90 | Fill #1

## 2020-09-03 MED FILL — LITHIUM ER 450 MG TABLET: 450 | 30 days supply | Qty: 60 | Fill #1

## 2020-09-07 ENCOUNTER — Telehealth (HOSPITAL_COMMUNITY): Payer: Self-pay | Admitting: Psychiatry

## 2020-09-08 ENCOUNTER — Other Ambulatory Visit (HOSPITAL_COMMUNITY): Payer: Self-pay | Admitting: Psychiatry

## 2020-09-08 DIAGNOSIS — F411 Generalized anxiety disorder: Secondary | ICD-10-CM

## 2020-09-08 MED ORDER — QUETIAPINE FUMARATE 25 MG PO TABS: ORAL_TABLET | ORAL | 2 refills | Status: DC

## 2020-09-08 MED ORDER — HYDROXYZINE HCL 50 MG PO TABS: 50.0000 mg | ORAL_TABLET | Freq: Three times a day (TID) | ORAL | 2 refills | Status: DC | PRN

## 2020-09-08 MED FILL — hydrOXYzine HCL 50 MG TABS: 50 | 30 days supply | Qty: 90 | Fill #0

## 2020-09-08 NOTE — Telephone Encounter (Signed)
Patient informed provider that he feels that his medication are ineffective.  He notes that his mood fluctuates, he has racing thoughts, irritability, distractibility, and has inappropriate sexual behaviors with people.  He notes that his anxiety has increased and is unsure if it is because he is starting a new position at his job or if it is because he is mentally unstable.  Today he is agreeable to increasing Seroquel 50 mg at night.  He is also agreeable to increase hydroxyzine 25 mg 3 times a day to 50 mg 3 times a day.  He will continue all other medications as prescribed.  No other concerns noted at this time.

## 2020-09-09 MED FILL — QUETIAPINE FUMARATE 25 MG T: 25 | 30 days supply | Qty: 120 | Fill #0

## 2020-09-14 ENCOUNTER — Encounter (HOSPITAL_COMMUNITY): Payer: No Payment, Other | Admitting: Psychiatry

## 2020-09-20 ENCOUNTER — Telehealth (HOSPITAL_COMMUNITY): Payer: Self-pay | Admitting: *Deleted

## 2020-09-20 ENCOUNTER — Other Ambulatory Visit (HOSPITAL_COMMUNITY): Payer: Self-pay | Admitting: Psychiatry

## 2020-09-20 DIAGNOSIS — F411 Generalized anxiety disorder: Secondary | ICD-10-CM

## 2020-09-20 MED ORDER — BUSPIRONE HCL 15 MG PO TABS: 15.0000 mg | ORAL_TABLET | Freq: Three times a day (TID) | ORAL | 2 refills | Status: DC

## 2020-09-20 MED ORDER — QUETIAPINE FUMARATE 100 MG PO TABS: 100.0000 mg | ORAL_TABLET | Freq: Every day | ORAL | 2 refills | Status: DC

## 2020-09-20 MED FILL — QUETIAPINE FUMARATE 100 MG: 100 | 30 days supply | Qty: 30 | Fill #0

## 2020-09-20 NOTE — Telephone Encounter (Signed)
Patient notes that he has been having fluctuations in mood, impulsive behaviors(unsafe sexual practices), irritability, distractibility, and racing thoughts.  Patient also informed provider that he is paranoid.  He notes that he feels like all white people is out to get him.  He denies having adverse negative interaction with Caucasian people he informed provider that he does not understand the black experience and was adopted by a white family who he gets along with. He notes that he continues to be overly anxious as well and notes that at times he has panic attacks at work.  Today he is agreeable to increasing BuSpar 10 mg 3 times daily to 15 mg 3 times daily to help manage his anxiety.  He will also start Seroquel 100 mg nightly to help manage impulsive behaviors and paranoia. Potential side effects of medication and risks vs benefits of treatment vs non-treatment were explained and discussed. All questions were answered.  No other concerns noted at this time.

## 2020-09-20 NOTE — Telephone Encounter (Signed)
PATIENT LVM STATING THAT HE'S BEEN HAVING PANIC ATTACKS FOR THE LAST 2 DAYS  & THAT HE'S NOT SURE IF SWITCHING MED OR A INCREASE WILL HELP. PLEASE CALL

## 2020-09-28 DIAGNOSIS — Z20822 Contact with and (suspected) exposure to covid-19: Secondary | ICD-10-CM | POA: Diagnosis not present

## 2020-09-28 DIAGNOSIS — Z03818 Encounter for observation for suspected exposure to other biological agents ruled out: Secondary | ICD-10-CM | POA: Diagnosis not present

## 2020-11-02 ENCOUNTER — Telehealth (INDEPENDENT_AMBULATORY_CARE_PROVIDER_SITE_OTHER): Payer: No Payment, Other | Admitting: Psychiatry

## 2020-11-02 ENCOUNTER — Telehealth (HOSPITAL_COMMUNITY): Payer: Self-pay | Admitting: *Deleted

## 2020-11-02 DIAGNOSIS — F411 Generalized anxiety disorder: Secondary | ICD-10-CM

## 2020-11-02 NOTE — Telephone Encounter (Signed)
Call from patient stating he had negative side effects when he started taking new medicine. Last night her reported he didn't sleep at all and woke up this am with a severe headache so bad it prevented him from going to work. He is asking for a work excuse note and a medication adjustment. He has not had an appt since 2/28, late to start new meds on 4/11. Will forward his concern to Dr Doyne Keel for direction.

## 2020-11-02 NOTE — Telephone Encounter (Signed)
Patient notes that he continues to take his medications as prescribed however informed provider that he has had increased stressors at work that is interfering with his mental health.  He notes that at times he has difficulty sleeping.  He notes that some days he sleeps for 8 hours and other days he barely gets any sleep.  He notes while at work he has his hands filled because he is Catering manager of dietary and notes that he has a lot on his plate.  Today he notes that he does not want to change his medication regimen because that he feels that they are working.  He however request that provider write a letter requesting accommodations for him at work.  Provider was agreeable to this and wrote patient a letter which she can pick up at the front desk at his convenience.  No other concerns noted at this time.

## 2020-11-03 NOTE — Telephone Encounter (Signed)
Patient notes that he continues to take his medications as prescribed however informed provider that he has had increased stressors at work that is interfering with his mental health.  He notes that at times he has difficulty sleeping.  He notes that some days he sleeps for 8 hours and other days he barely gets any sleep.  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  Today he notes that he does not want to change his medication regimen because that he feels that they are working.  He however request that provider write a letter requesting accommodations for him at work.  Provider was agreeable to this and wrote patient a letter which she can pick up at the front desk at his convenience.  No other concerns noted at this time.

## 2020-12-01 ENCOUNTER — Other Ambulatory Visit: Payer: Self-pay

## 2020-12-01 MED FILL — Buspirone HCl Tab 15 MG: ORAL | 30 days supply | Qty: 90 | Fill #0 | Status: AC

## 2020-12-01 MED FILL — Hydroxyzine HCl Tab 50 MG: ORAL | 30 days supply | Qty: 90 | Fill #0 | Status: AC

## 2020-12-01 MED FILL — Lithium Carbonate Tab ER 450 MG: ORAL | 30 days supply | Qty: 60 | Fill #0 | Status: AC

## 2020-12-01 MED FILL — Quetiapine Fumarate Tab 100 MG: ORAL | 30 days supply | Qty: 30 | Fill #0 | Status: AC

## 2020-12-01 MED FILL — Bupropion HCl Tab ER 24HR 300 MG: ORAL | 30 days supply | Qty: 30 | Fill #0 | Status: AC

## 2021-02-08 ENCOUNTER — Encounter (HOSPITAL_COMMUNITY): Payer: Self-pay | Admitting: Emergency Medicine

## 2021-02-08 ENCOUNTER — Other Ambulatory Visit: Payer: Self-pay

## 2021-02-08 ENCOUNTER — Other Ambulatory Visit (HOSPITAL_COMMUNITY): Payer: Self-pay | Admitting: Psychiatry

## 2021-02-08 ENCOUNTER — Emergency Department (HOSPITAL_COMMUNITY)
Admission: EM | Admit: 2021-02-08 | Discharge: 2021-02-08 | Disposition: A | Discharge status: Home / Self Care | Payer: Medicaid - Out of State | Attending: Emergency Medicine | Admitting: Emergency Medicine

## 2021-02-08 DIAGNOSIS — F603 Borderline personality disorder: Secondary | ICD-10-CM | POA: Insufficient documentation

## 2021-02-08 DIAGNOSIS — F333 Major depressive disorder, recurrent, severe with psychotic symptoms: Secondary | ICD-10-CM

## 2021-02-08 DIAGNOSIS — Z760 Encounter for issue of repeat prescription: Secondary | ICD-10-CM | POA: Insufficient documentation

## 2021-02-08 MED ORDER — BUPROPION HCL ER (XL) 300 MG PO TB24
ORAL_TABLET | ORAL | 2 refills | Status: DC
  Filled 2021-02-08 – 2021-02-16 (×2): qty 30, 30d supply, fill #0

## 2021-02-08 MED ORDER — LITHIUM CARBONATE ER 450 MG PO TBCR
EXTENDED_RELEASE_TABLET | ORAL | 2 refills | Status: DC
  Filled 2021-02-08 – 2021-02-16 (×2): qty 60, 30d supply, fill #0

## 2021-02-08 MED FILL — Quetiapine Fumarate Tab 100 MG: ORAL | 30 days supply | Qty: 30 | Fill #1 | Status: CN

## 2021-02-08 MED FILL — Buspirone HCl Tab 15 MG: ORAL | 30 days supply | Qty: 90 | Fill #1 | Status: CN

## 2021-02-08 MED FILL — Hydroxyzine HCl Tab 50 MG: ORAL | 30 days supply | Qty: 90 | Fill #1 | Status: CN

## 2021-02-08 NOTE — ED Triage Notes (Signed)
Patient presents requesting refills for his medications. No other complaints.

## 2021-02-08 NOTE — Discharge Instructions (Addendum)
You were evaluated in the Emergency Department and after careful evaluation, we did not find any emergent condition requiring admission or further testing in the hospital.  Your exam/testing today was overall reassuring.  We have refilled your lithium prescription.  Recommend follow-up with outpatient counseling or psychiatry.  Please return to the Emergency Department if you experience any worsening of your condition.  Thank you for allowing [place] to be a part of your care.

## 2021-02-08 NOTE — ED Provider Notes (Signed)
[facility]PT [facility] Emergency Department Provider Note MRN:  [id number]  Arrival date & time: 02/08/21     Chief Complaint   Medication Refill   History of Present Illness   Jose Stevenson is a 30 y.o. year-old male with a history of borderline personality disorder presenting to the ED with chief complaint of medication refill.  Patient has been out of his lithium for 1 month.  Starting to have issues with self-confidence, feelings of self-worth.  Starting to impair his daily life and his job.  Denies any suicidal or homicidal ideation, no audiovisual hallucinations, no other complaints at this time.  Review of Systems  A problem-focused ROS was performed. Positive for medication refill.  Patient denies SI.  Patient's Health History    Past Medical History:  Diagnosis Date   Anxiety    Depression    PTSD (post-traumatic stress disorder)     History reviewed. No pertinent surgical history.  No family history on file.  Social History   Socioeconomic History   Marital status: Single    Spouse name: Not on file   Number of children: Not on file   Years of education: Not on file   Highest education level: Not on file  Occupational History   Not on file  Tobacco Use   Smoking status: Never   Smokeless tobacco: Never  Substance and Sexual Activity   Alcohol use: Yes    Comment: 1 bottle of champagne daily   Drug use: Yes    Frequency: 3.0 times per week    Types: Marijuana   Sexual activity: Yes  Other Topics Concern   Not on file  Social History Narrative   Not on file   Social Determinants of Health   Financial Resource Strain: Low Risk    Difficulty of Paying Living Expenses: Not hard at all  Food Insecurity: No Food Insecurity   Worried About Programme researcher, broadcasting/film/video in the Last Year: Never true   Ran Out of Food in the Last Year: Never true  Transportation Needs: No Transportation Needs   Lack of Transportation (Medical): No   Lack of  Transportation (Non-Medical): No  Physical Activity: Sufficiently Active   Days of Exercise per Week: 4 days   Minutes of Exercise per Session: 60 min  Stress: Stress Concern Present   Feeling of Stress : Very much  Social Connections: Socially Isolated   Frequency of Communication with Friends and Family: More than three times a week   Frequency of Social Gatherings with Friends and Family: Once a week   Attends Religious Services: Never   Database administrator or Organizations: No   Attends Engineer, structural: Never   Marital Status: Never married  Catering manager Violence: Not At Risk   Fear of Current or Ex-Partner: No   Emotionally Abused: No   Physically Abused: No   Sexually Abused: No     Physical Exam   Vitals:   02/08/21 0357  BP: (!) 129/92  Pulse: 78  Resp: 16  Temp: 98.2 F (36.8 C)  SpO2: 100%    CONSTITUTIONAL: Well-appearing, NAD NEURO:  Alert and oriented x 3, no focal deficits EYES:  eyes equal and reactive ENT/NECK:  no LAD, no JVD CARDIO: Regular rate, well-perfused, normal S1 and S2 PULM:  CTAB no wheezing or rhonchi GI/GU:  normal bowel sounds, non-distended, non-tender MSK/SPINE:  No gross deformities, no edema SKIN:  no rash, atraumatic PSYCH:  Appropriate speech and behavior  *  Additional and/or pertinent findings included in MDM below  Diagnostic and Interventional Summary    EKG Interpretation  Date/Time:    Ventricular Rate:    PR Interval:    QRS Duration:   QT Interval:    QTC Calculation:   R Axis:     Text Interpretation:         Labs Reviewed - No data to display  No orders to display    Medications - No data to display   Procedures  /  Critical Care Procedures  ED Course and Medical Decision Making  I have reviewed the triage vital signs, the nursing notes, and pertinent available records from the EMR.  Listed above are laboratory and imaging tests that I personally ordered, reviewed, and interpreted  and then considered in my medical decision making (see below for details).  Patient is lucid and pleasant, not in any type of psychiatric crisis, simply here for medication refill.  Lost his insurance recently but has a new job.  No indication for further medical or psychiatric testing, appropriate for discharge.       Elmer Sow. Pilar Plate, MD [facility] Emergency Medicine [facility] [email or web address]  Final Clinical Impressions(s) / ED Diagnoses     ICD-10-CM   1. Medication refill  Z76.0     2. Borderline personality disorder (HCC)  F60.3 lithium carbonate (ESKALITH) 450 MG CR tablet      ED Discharge Orders          Ordered    lithium carbonate (ESKALITH) 450 MG CR tablet        02/08/21 0416             Discharge Instructions Discussed with and Provided to Patient:    Discharge Instructions      You were evaluated in the Emergency Department and after careful evaluation, we did not find any emergent condition requiring admission or further testing in the hospital.  Your exam/testing today was overall reassuring.  We have refilled your lithium prescription.  Recommend follow-up with outpatient counseling or psychiatry.  Please return to the Emergency Department if you experience any worsening of your condition.  Thank you for allowing [place] to be a part of your care.        Sabas Sous, MD 02/08/21 [phone]

## 2021-02-09 ENCOUNTER — Other Ambulatory Visit: Payer: Self-pay

## 2021-02-11 ENCOUNTER — Other Ambulatory Visit: Payer: Self-pay

## 2021-02-11 ENCOUNTER — Ambulatory Visit (HOSPITAL_COMMUNITY)
Admission: EM | Admit: 2021-02-11 | Discharge: 2021-02-11 | Disposition: A | Discharge status: Home / Self Care | Payer: No Payment, Other | Attending: Psychiatry | Admitting: Psychiatry

## 2021-02-11 DIAGNOSIS — F32A Depression, unspecified: Secondary | ICD-10-CM | POA: Insufficient documentation

## 2021-02-11 DIAGNOSIS — Z9151 Personal history of suicidal behavior: Secondary | ICD-10-CM | POA: Insufficient documentation

## 2021-02-11 DIAGNOSIS — F1290 Cannabis use, unspecified, uncomplicated: Secondary | ICD-10-CM | POA: Insufficient documentation

## 2021-02-11 DIAGNOSIS — F411 Generalized anxiety disorder: Secondary | ICD-10-CM | POA: Insufficient documentation

## 2021-02-11 DIAGNOSIS — Z7289 Other problems related to lifestyle: Secondary | ICD-10-CM | POA: Insufficient documentation
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
## 2021-02-11 NOTE — BHH Counselor (Signed)
TTS triage: Patient is a 30 year old male presenting voluntarily to [facility] for evaluation. He states he used to come here for outpatient but had a change in insurance and has been out of his medications. He is looking to be restarted on meds. He reports a hx of borderline personality disorder. He denies SI/HI/AVH. He reports using THC and alcohol last night but no use today.  Patient is routine.

## 2021-02-15 ENCOUNTER — Other Ambulatory Visit: Payer: Self-pay

## 2021-02-16 ENCOUNTER — Other Ambulatory Visit: Payer: Self-pay

## 2021-02-16 MED FILL — Hydroxyzine HCl Tab 50 MG: ORAL | 30 days supply | Qty: 90 | Fill #1 | Status: CN

## 2021-02-16 MED FILL — Quetiapine Fumarate Tab 100 MG: ORAL | 30 days supply | Qty: 30 | Fill #1 | Status: CN

## 2021-02-16 MED FILL — Quetiapine Fumarate Tab 100 MG: ORAL | 30 days supply | Qty: 30 | Fill #1 | Status: AC

## 2021-02-16 MED FILL — Buspirone HCl Tab 15 MG: ORAL | 30 days supply | Qty: 90 | Fill #1 | Status: AC

## 2021-02-16 MED FILL — Hydroxyzine HCl Tab 50 MG: ORAL | 30 days supply | Qty: 90 | Fill #1 | Status: AC

## 2021-02-17 ENCOUNTER — Other Ambulatory Visit: Payer: Self-pay

## 2021-05-25 ENCOUNTER — Other Ambulatory Visit: Payer: Self-pay

## 2021-05-25 MED ORDER — BUPROPION HCL ER (XL) 300 MG PO TB24
ORAL_TABLET | ORAL | 0 refills | Status: DC
  Filled 2021-05-25: qty 30, 30d supply, fill #0

## 2021-05-25 MED ORDER — MIRTAZAPINE 15 MG PO TABS
ORAL_TABLET | ORAL | 0 refills | Status: DC
  Filled 2021-05-25: qty 30, 30d supply, fill #0

## 2021-05-27 ENCOUNTER — Other Ambulatory Visit: Payer: Self-pay

## 2021-06-10 ENCOUNTER — Ambulatory Visit (INDEPENDENT_AMBULATORY_CARE_PROVIDER_SITE_OTHER): Payer: 59 | Admitting: Licensed Clinical Social Worker

## 2021-06-10 ENCOUNTER — Other Ambulatory Visit: Payer: Self-pay

## 2021-06-10 DIAGNOSIS — F411 Generalized anxiety disorder: Secondary | ICD-10-CM | POA: Diagnosis not present

## 2021-06-10 DIAGNOSIS — F332 Major depressive disorder, recurrent severe without psychotic features: Secondary | ICD-10-CM
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
## 2021-06-21 ENCOUNTER — Encounter (HOSPITAL_COMMUNITY): Payer: Self-pay | Admitting: Psychiatry

## 2021-06-21 ENCOUNTER — Ambulatory Visit (INDEPENDENT_AMBULATORY_CARE_PROVIDER_SITE_OTHER): Payer: 59 | Admitting: Psychiatry

## 2021-06-21 ENCOUNTER — Other Ambulatory Visit: Payer: Self-pay

## 2021-06-21 VITALS — BP 132/85 | HR 71 | Ht 73.0 in | Wt 163.0 lb

## 2021-06-21 DIAGNOSIS — F411 Generalized anxiety disorder: Secondary | ICD-10-CM

## 2021-06-21 DIAGNOSIS — F603 Borderline personality disorder: Secondary | ICD-10-CM

## 2021-06-21 DIAGNOSIS — F1220 Cannabis dependence, uncomplicated: Secondary | ICD-10-CM

## 2021-06-21 MED ORDER — VORTIOXETINE HBR 10 MG PO TABS
10.0000 mg | ORAL_TABLET | Freq: Every day | ORAL | 3 refills | Status: DC
  Filled 2021-06-21 – 2021-08-08 (×2): qty 30, 30d supply, fill #0

## 2021-06-21 MED ORDER — LITHIUM CARBONATE ER 300 MG PO TBCR
300.0000 mg | EXTENDED_RELEASE_TABLET | Freq: Two times a day (BID) | ORAL | 3 refills | Status: DC
  Filled 2021-06-21 – 2021-08-08 (×2): qty 60, 30d supply, fill #0

## 2021-06-21 MED ORDER — TRAZODONE HCL 50 MG PO TABS
ORAL_TABLET | Freq: Every evening | ORAL | 3 refills | Status: DC | PRN
  Filled 2021-06-21 – 2021-08-08 (×2): qty 30, 30d supply, fill #0

## 2021-06-21 MED ORDER — HYDROXYZINE HCL 25 MG PO TABS
25.0000 mg | ORAL_TABLET | Freq: Three times a day (TID) | ORAL | 3 refills | Status: DC | PRN
  Filled 2021-06-21 – 2021-08-08 (×2): qty 90, 30d supply, fill #0
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
## 2021-06-23 ENCOUNTER — Other Ambulatory Visit: Payer: Self-pay

## 2021-06-23 ENCOUNTER — Ambulatory Visit (INDEPENDENT_AMBULATORY_CARE_PROVIDER_SITE_OTHER): Payer: 59 | Admitting: Licensed Clinical Social Worker

## 2021-06-23 DIAGNOSIS — F411 Generalized anxiety disorder: Secondary | ICD-10-CM

## 2021-06-24 NOTE — Progress Notes (Signed)
THERAPIST PROGRESS NOTE  Session Time: 55 min  Participation Level: Active  Behavioral Response: CasualAlertEuthymic  Type of Therapy: Individual Therapy  Treatment Goals addressed: Communication: dep/anx/coping  Interventions: Supportive and Other: additional assessment  Summary: Jose Stevenson is a 30 y.o. male who presents with hx of anx/dep. Today pt returns for in person session. This is first session since initial session. Pt reports "I'm back and I did my homework".  LCSW commended him for both. He is noted to be in an uplifted mood. LCSW first assessed for status of Thanksgiving and going to [place] to grandfathers. Pt states he did go and stayed 3 days. He reports he is not overly fond of grandfather's current spouse but otherwise the visit went well. He states he also called his adoptive mother on Thanksgiving. Exploration of this decision reveals pt learned his grandmother died and an aunt died. He calls his adoptive mother Massie Bougie and adoptive father is Tammy Sours. He states he has not talked to either in years. He reports he said to [place] "I wanted to say hello and thank you for taking me in". He states he only spoke with [place] and she cried. Pt feels good about having this communication and when asked states he is going to call again on Christmas day. Pt reports he is back together with boyfriend, Ace, he had just broken up with. He states Ace remains in [place] but they are communicating and will be planning to visit with one another here or in [place]. Ace is reportedly 32 yrs of age and comes from a poor family. Kathlene November reports they had some deep conversations about miscommunications that led to their break up and both are pleased to be understanding one another better. Assessment of pt's intentions r/t work reveals he intends to return to work FT next wk and is looking forward to doing so. Pt states he feels the notion of success is "overrated". In processing this thought he does conclude he  can make his own definition of success at this point in his life. He has subscribed to success as being financially advantaged until now. Pt's homework was to make a prioritized list of concerns he wished to process in counseling. He states he has the list in his head. First is dealing with losing his bio parents and being "given up". Second is understanding his tendency to break off relationships. Third is BPD. Pt reports he did end up searching for and finding his bio parents at age [age]. He states they were receptive to meeting him and pt visited with them "over 100 times" before the relationship ended. Pt states they were living in poverty and as they continued to visit bio parents started to ask him for more and more help. He reports he gave father a $100 bill for a $20 expenditure.  When he ask for his change bio father refused to give change which resulted in a significant argument in the relationship ended.  This happened 2 to 3 years ago and Kathlene November has no intention of communicating with them again.  LCSW assessed for patient's understanding of grief and the grief process.  Patient states "it happens and its normal".  He admits he has little understanding of the details of the grief process.  Throughout session patient is noted to be saying "of course" repeatedly.  When this is brought to patient's attention, including how it impacts communication, he states he did not realize how often he was saying this and intends to start  altering his use of this phrase. Pt feels he is coping adequately at present and looking forward to continuing counseling. LCSW reviewed poc including scheduling prior to close of session. Pt states appreciation for care.   Suicidal/Homicidal: Nowithout intent/plan  Therapist Response: Pt receptive to care.  Plan: Return again in ~4 weeks.  Diagnosis: Axis I: Generalized Anxiety Disorder   [place] Sink, LCSW 06/24/2021

## 2021-08-08 ENCOUNTER — Other Ambulatory Visit: Payer: Self-pay

## 2021-08-09 ENCOUNTER — Other Ambulatory Visit: Payer: Self-pay

## 2021-08-09 ENCOUNTER — Ambulatory Visit (INDEPENDENT_AMBULATORY_CARE_PROVIDER_SITE_OTHER): Payer: 59 | Admitting: Licensed Clinical Social Worker

## 2021-08-09 DIAGNOSIS — F411 Generalized anxiety disorder: Secondary | ICD-10-CM

## 2021-08-10 NOTE — Progress Notes (Signed)
THERAPIST PROGRESS NOTE  Session Time: 64 min  Participation Level: Active  Behavioral Response: CasualAlertAnxious  Type of Therapy: Individual Therapy  Treatment Goals addressed: Anxiety and Coping  Interventions: CBT, Solution Focused, Supportive, and Reframing  Summary: Jose Stevenson is a 31 y.o. male who presents with hx of GAD.  Today patient returns for in person session.  LCSW assessed for status of medications.  Patient reports he has taken medications as prescribed but he feels they are of no benefit. Pt has med management f/u and will address alternatives with provider.  Patient reports ongoing struggles with racing thoughts, sweaty hands.  He reports his mind is either in the past or the future and its "impossible to relax".  LCSW reminded patient of self talk and reframed "impossible". Patient agrees to continue to work on his self talk.  LCSW assessed for significant changes since last session and asked about family contact over the Christmas holiday.  Patient reports he did call his adoptive mother Jose Stevenson on Christmas and states he also had "a long heart to heart with her yesterday".  When asked patient states he has also talked with his adoptive father Jose Stevenson.  Jose Stevenson reports Jose Stevenson told him yesterday she thought Jose Stevenson "hated them", which Jose Stevenson corrected.  Patient reports they are planning to meet for dinner this weekend.  Jose Stevenson and Jose Stevenson live in [place] and they have yet to determine a meeting spot.  In reference to other significant changes, patient presents LCSW with a folder and asks clinician to open folder.  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  Patient reports he is already in paid training and will likely begin his job February 1.  The job will be completely remote with the exception of quarterly meetings.  LCSW congratulated patient.  He provides details of how this  has been an ongoing 7-month process.  He is able to say he is excited but stops short of saying he is proud.  Patient reports communication with the job supervisor for the position that is being held for him at [employer] resulted in supervisor saying Jose Stevenson "needs to pay it forward".  Jose Stevenson reports she has asked him to come to do training at [employer] related to what he learns from training at [facility].  Jose Stevenson reports he felt obligated to agree to this because the supervisor gave him the lead on the [employer] position.  LCSW assessed for status of relationship with boyfriend Jose Stevenson.  Patient gives a notable deep sigh.  He states they are continuing to communicate.  He states he finds his boyfriend "annoying" but loves that he "gets me" and does not know if he could find someone else he feels will get him.  Additional assessment reveals he is annoyed by boyfriends "lack of determination".  Patient reports "we are polar opposites" and talks about what an assertive go-getter he is versus his boyfriend taking no initiative on anything.  Patient reports their sole method of communication is messaging.  LCSW provided education on the components of communication and what is being missed by messaging only.  Patient verbalizes understanding with intent to expand methods of communication.  Remainder of session spent addressing patient's goal of better coping with being abandoned by biological parents and constant insecurity in foster care system moving from fam to [place] until age of 74 when adopted.  LCSW provided detailed education related to loss, grief, control and  safety in pivotal years of development.  LCSW provided literature for patient to take home with him related to loss of anything of real value.  Patient accepting of education provided and reports it being helpful.  LCSW provided education on letter writing to externalize some of his feelings.  Patient agrees to make a prioritized list of who it might be helpful to write a letter  to.  He will bring list to next session. Patient is noted to have significantly changed his use of the phrase "of course" and when this is mention he reports how much he has worked on reducing the use of this phrase since last session. LCSW commended his efforts and encouraged ongoing self awareness. LCSW reviewed poc including scheduling prior to close of session. Pt states appreciation for care.    Suicidal/Homicidal: Nowithout intent/plan  Therapist Response: Pt very receptive to care.  Plan: Return again in ~2 weeks.  Diagnosis: Axis I: Generalized Anxiety Disorder  [place] Sink, LCSW 08/10/2021

## 2021-08-11 ENCOUNTER — Other Ambulatory Visit (HOSPITAL_BASED_OUTPATIENT_CLINIC_OR_DEPARTMENT_OTHER): Payer: Self-pay

## 2021-08-18 ENCOUNTER — Other Ambulatory Visit: Payer: Self-pay

## 2021-08-18 MED ORDER — AMOXICILLIN 500 MG PO CAPS
ORAL_CAPSULE | ORAL | 0 refills | Status: DC
  Filled 2021-08-18: qty 21, 7d supply, fill #0

## 2021-08-18 MED ORDER — CHLORHEXIDINE GLUCONATE 0.12 % MT SOLN
OROMUCOSAL | 0 refills | Status: DC
  Filled 2021-08-18: qty 473, 14d supply, fill #0

## 2021-08-18 MED ORDER — IBUPROFEN 800 MG PO TABS
800.0000 mg | ORAL_TABLET | Freq: Three times a day (TID) | ORAL | 0 refills | Status: DC | PRN
  Filled 2021-08-18: qty 20, 7d supply, fill #0

## 2021-08-19 ENCOUNTER — Ambulatory Visit: Payer: Self-pay | Admitting: Internal Medicine

## 2021-08-23 ENCOUNTER — Telehealth (HOSPITAL_COMMUNITY): Payer: Self-pay | Admitting: *Deleted

## 2021-08-23 NOTE — Telephone Encounter (Signed)
Left a VM on writers phone stating he has called 10 times and for [place] to go ahead and take our time getting back to him since we have so much to do. I called him back, and asked how I could help him as he left me a VM, he responded since he left a message how can I help him. Asked him what medicine he wanted adjusted and that most med adjustments required an appt. He never spoke again, Clinical research associate said he is welcome to call back when he wants to speak.

## 2021-08-25 ENCOUNTER — Ambulatory Visit (HOSPITAL_COMMUNITY): Payer: 59 | Admitting: Licensed Clinical Social Worker

## 2021-09-08 ENCOUNTER — Ambulatory Visit (HOSPITAL_COMMUNITY): Payer: 59 | Admitting: Licensed Clinical Social Worker

## 2021-09-13 ENCOUNTER — Telehealth (INDEPENDENT_AMBULATORY_CARE_PROVIDER_SITE_OTHER): Payer: 59 | Admitting: Psychiatry

## 2021-09-13 ENCOUNTER — Encounter (HOSPITAL_COMMUNITY): Payer: Self-pay | Admitting: Psychiatry

## 2021-09-13 ENCOUNTER — Other Ambulatory Visit: Payer: Self-pay

## 2021-09-13 DIAGNOSIS — F411 Generalized anxiety disorder: Secondary | ICD-10-CM

## 2021-09-13 DIAGNOSIS — F603 Borderline personality disorder: Secondary | ICD-10-CM | POA: Diagnosis not present

## 2021-09-13 MED ORDER — GABAPENTIN 300 MG PO CAPS
300.0000 mg | ORAL_CAPSULE | Freq: Three times a day (TID) | ORAL | 3 refills | Status: DC
  Filled 2021-09-13: qty 90, 30d supply, fill #0
  Filled 2021-10-31 (×3): qty 90, 30d supply, fill #1
  Filled 2021-12-07: qty 90, 30d supply, fill #2

## 2021-09-13 MED ORDER — VORTIOXETINE HBR 20 MG PO TABS
20.0000 mg | ORAL_TABLET | Freq: Every day | ORAL | 3 refills | Status: DC
  Filled 2021-09-13: qty 30, 30d supply, fill #0

## 2021-09-13 MED ORDER — HYDROXYZINE HCL 25 MG PO TABS
25.0000 mg | ORAL_TABLET | Freq: Three times a day (TID) | ORAL | 3 refills | Status: DC | PRN
  Filled 2021-09-13: qty 90, 30d supply, fill #0

## 2021-09-13 MED ORDER — TRAZODONE HCL 50 MG PO TABS
ORAL_TABLET | Freq: Every evening | ORAL | 3 refills | Status: DC | PRN
  Filled 2021-09-13: qty 30, 30d supply, fill #0
  Filled 2021-11-02: qty 30, 30d supply, fill #1
  Filled 2021-12-07: qty 30, 30d supply, fill #2

## 2021-09-13 MED ORDER — LITHIUM CARBONATE ER 300 MG PO TBCR
300.0000 mg | EXTENDED_RELEASE_TABLET | Freq: Two times a day (BID) | ORAL | 3 refills | Status: DC
  Filled 2021-09-13: qty 60, 30d supply, fill #0
  Filled 2021-10-31: qty 60, 30d supply, fill #1
  Filled 2021-12-07: qty 60, 30d supply, fill #2

## 2021-09-13 NOTE — Progress Notes (Signed)
Virtual Visit via Video Note  I connected with Jose Stevenson on 09/13/21 at  1:30 PM EST by a video enabled telemedicine application and verified that I am speaking with the correct person using two identifiers.  Location: Patient: Home Provider: Clinic   I discussed the limitations of evaluation and management by telemedicine and the availability of in person appointments. The patient expressed understanding and agreed to proceed.  I provided 30 minutes of non-face-to-face time during this encounter.    09/13/2021 1:58 PM Jose Stevenson  MRN:  [id number]  Chief Complaint: "Im having issues with black people and medications are horrible"    HPI: 31 year old male seen today for follow up psychiatric evaluation.   He has a psychiatric history of anxiety, depression, borderline personality, and marijuana dependence.  He is currently managed on lithium 300 mg twice daily,  trazodone 50 mg nightly, Trintellix 10 mg daily, hydroxyzine 25 mg 3 times daily,   He notes that his medications are somewhat effective in managing his psychiatric conditions.  Today he is well-groomed, pleasant, cooperative, and engaged in conversation.  He informed Probation officer that he has is having issues with black people specifically his neighbors who he describes as heaters.  He also notes that his medication is horrible and reports that his anxiety and depression are not well managed.  Today provider conducted a GAD-7 and patient scored a 19, at his last visit he scored 20.  He informed Probation officer that he is worried about his mental health, his relationships, and his new job Scientist, product/process development at [employer]).  Patient notes that his anxiety worsens in social settings.  He denies use of marijuana noting that he discontinued the substance.  He notes that he drinks twice a week 1 glass of an alcoholic beverage.  Today provider also conducted PHQ-9 he scored an 18, at his last visit he scored a 19.  He notes that he sleeps  approximately 6 hours nightly.  He endorses having adequate appetite.  Patient endorses passive SI however notes that he will not harm himself.  He denies SI/HI/VAH, mania, or paranoia.    Patient reports that his mood is stable on lithium and reports that he is not having as many symptoms of BPD as he did in the past.  Provider asked the patient if he had a support network in [place].  He notes that his partner, parents, and grandparents are in a different state.  He does note that he finds his counselor supportive.  Today patient is agreeable to starting gabapentin 300 mg 3 times daily to help manage anxiety, depression, and mood.  Trintellix increased from 10 mg to 20 mg to help manage symptoms of anxiety and depression.  Patient will continue all other medications as prescribed.  Provider discussed increasing hydroxyzine at next visit if anxiety is not better managed.  He endorsed understanding and agreed.  He will follow-up with outpatient counseling for therapy.  No other concerns at this time.     Visit Diagnosis:    ICD-10-CM   1. Generalized anxiety disorder  F41.1 vortioxetine HBr (TRINTELLIX) 20 MG TABS tablet    hydrOXYzine (ATARAX) 25 MG tablet    gabapentin (NEURONTIN) 300 MG capsule    2. Borderline personality disorder ([place])  F60.3 traZODone (DESYREL) 50 MG tablet    lithium carbonate (LITHOBID) 300 MG CR tablet    gabapentin (NEURONTIN) 300 MG capsule      Past Psychiatric History: Anxiety, depression, borderline personality, and marijuana dependence.  Past Medical History:  Past Medical History:  Diagnosis Date   Anxiety    Depression    PTSD (post-traumatic stress disorder)    History reviewed. No pertinent surgical history.  Family Psychiatric History: Biological mother bipolar, Biological father bipolar disorder, paternal grandmother bipolar  Family History: History reviewed. No pertinent family history.  Social History:  Social History    Socioeconomic History   Marital status: Single    Spouse name: Not on file   Number of children: Not on file   Years of education: Not on file   Highest education level: Not on file  Occupational History   Not on file  Tobacco Use   Smoking status: Never   Smokeless tobacco: Never  Substance and Sexual Activity   Alcohol use: Yes    Comment: 1 bottle of champagne daily   Drug use: Yes    Frequency: 3.0 times per week    Types: Marijuana   Sexual activity: Yes  Other Topics Concern   Not on file  Social History Narrative   Not on file   Social Determinants of Health   Financial Resource Strain: Not on file  Food Insecurity: Not on file  Transportation Needs: Not on file  Physical Activity: Not on file  Stress: Not on file  Social Connections: Not on file    Allergies: No Known Allergies  Metabolic Disorder Labs: Lab Results  Component Value Date   HGBA1C 4.7 (L) 03/13/2020   MPG 88.19 03/13/2020   No results found for: PROLACTIN Lab Results  Component Value Date   CHOL 173 03/13/2020   TRIG 77 03/13/2020   HDL 42 03/13/2020   CHOLHDL 4.1 03/13/2020   VLDL 15 03/13/2020   LDLCALC 116 (H) 03/13/2020   Lab Results  Component Value Date   TSH 2.732 03/17/2020   TSH 1.667 03/13/2020    Therapeutic Level Labs: Lab Results  Component Value Date   LITHIUM 0.44 (L) 03/17/2020   LITHIUM 0.56 (L) 03/13/2020   No results found for: VALPROATE No components found for:  CBMZ  Current Medications: Current Outpatient Medications  Medication Sig Dispense Refill   gabapentin (NEURONTIN) 300 MG capsule Take 1 capsule (300 mg total) by mouth 3 (three) times daily. 90 capsule 3   amoxicillin (AMOXIL) 500 MG capsule Take 1 tablet every 8 hours until finish 21 capsule 0   chlorhexidine (PERIDEX) 0.12 % solution Swish for one minute and expectorate twice daily for 10 days. 473 mL 0   hydrOXYzine (ATARAX) 25 MG tablet Take 1 tablet (25 mg total) by mouth 3 (three)  times daily as needed. 90 tablet 3   ibuprofen (ADVIL) 800 MG tablet Take 1 tablet every 8 hours as needed for pain 20 tablet 0   lithium carbonate (LITHOBID) 300 MG CR tablet Take 1 tablet (300 mg total) by mouth 2 (two) times daily. 60 tablet 3   traZODone (DESYREL) 50 MG tablet TAKE 1 TABLET (50 MG TOTAL) BY MOUTH AT BEDTIME AS NEEDED FOR SLEEP. 30 tablet 3   vortioxetine HBr (TRINTELLIX) 20 MG TABS tablet Take 1 tablet (20 mg total) by mouth daily. 30 tablet 3   No current facility-administered medications for this visit.     Musculoskeletal: Strength & Muscle Tone: within normal limits, telehealth visit Gait & Station: normal, telehealth visit Patient leans: N/A  Psychiatric Specialty Exam: Review of Systems  There were no vitals taken for this visit.There is no height or weight on file to calculate BMI.  General Appearance: Well Groomed  Eye Contact:  Good  Speech:  Clear and Coherent and Normal Rate  Volume:  Normal  Mood:  Anxious and Depressed  Affect:  Appropriate and Congruent  Thought Process:  Coherent, Goal Directed and Linear  Orientation:  Full (Time, Place, and Person)  Thought Content: WDL and Logical   Suicidal Thoughts:  No  Homicidal Thoughts:  No  Memory:  Immediate;   Good Recent;   Good Remote;   Good  Judgement:  Fair  Insight:  Good  Psychomotor Activity:  Normal  Concentration:  Concentration: Good and Attention Span: Good  Recall:  Good  Fund of Knowledge: Good  Language: Good  Akathisia:  No  Handed:  Right  AIMS (if indicated): Not done  Assets:  Communication Skills Desire for Improvement Financial Resources/Insurance Housing Intimacy Social Support  ADL's:  Intact  Cognition: WNL  Sleep:  Fair   Screenings: AUDIT    Flowsheet Row Admission (Discharged) from 03/13/2020 in [facility]  Alcohol Use Disorder Identification Test Final Score (AUDIT) 3      GAD-7    Flowsheet Row Video Visit from  09/13/2021 in [facility] Office Visit from 06/21/2021 in [place] from 06/10/2020 in [facility]  Total GAD-7 Score 19 20 19       PHQ2-9    Flowsheet Row Video Visit from 09/13/2021 in [facility] Office Visit from 06/21/2021 in [facility] Counselor from 06/10/2021 in [place] from 06/10/2020 in [facility] Counselor from 03/25/2020 in [place]  PHQ-2 Total Score 3 4 5 2 3   PHQ-9 Total Score 18 19 22 14 13       Flowsheet Row Video Visit from 09/13/2021 in [facility] Office Visit from 06/21/2021 in [facility] Counselor from 06/10/2021 in [facility] Error: Q7 should not be populated when Q6 is No Error: Q7 should not be populated when Q6 is No Low Risk        Assessment and Plan: Patient endorses symptoms of anxiety and depression.  Today patient is agreeable to starting gabapentin 300 mg 3 times daily to help manage anxiety, depression, and mood.  Trintellix increased from 10 mg to 20 mg to help manage symptoms of anxiety and depression.  Patient will continue all other medications as prescribed.  Provider discussed increasing hydroxyzine at next visit if anxiety is not better managed.  He endorsed understanding and agreed.    1. Generalized anxiety disorder  Increased- vortioxetine HBr (TRINTELLIX) 20 MG TABS tablet; Take 1 tablet (20 mg total) by mouth daily.  Dispense: 30 tablet; Refill: 3 Continue- hydrOXYzine (ATARAX) 25 MG tablet; Take 1 tablet (25 mg total) by mouth 3 (three) times daily as needed.  Dispense: 90 tablet; Refill: 3 Start- gabapentin (NEURONTIN) 300 MG capsule; Take 1 capsule (300 mg total) by  mouth 3 (three) times daily.  Dispense: 90 capsule; Refill: 3  2. Borderline personality disorder ([place])  Continue- traZODone (DESYREL) 50 MG tablet; TAKE 1 TABLET (50 MG TOTAL) BY MOUTH AT BEDTIME AS NEEDED FOR SLEEP.  Dispense: 30 tablet; Refill: 3 Continue- lithium carbonate (LITHOBID) 300 MG CR tablet; Take 1 tablet (300 mg total) by mouth 2 (two) times daily.  Dispense: 60 tablet; Refill: 3 Continue- gabapentin (  NEURONTIN) 300 MG capsule; Take 1 capsule (300 mg total) by mouth 3 (three) times daily.  Dispense: 90 capsule; Refill: 3  Follow-up in 3 month Follow-up with therapy  Salley Slaughter, NP 09/13/2021, 1:58 PM

## 2021-09-16 ENCOUNTER — Other Ambulatory Visit: Payer: Self-pay

## 2021-09-19 ENCOUNTER — Other Ambulatory Visit: Payer: Self-pay

## 2021-10-04 ENCOUNTER — Ambulatory Visit (HOSPITAL_COMMUNITY): Payer: BC Managed Care – PPO | Admitting: Licensed Clinical Social Worker

## 2021-10-07 ENCOUNTER — Emergency Department (HOSPITAL_BASED_OUTPATIENT_CLINIC_OR_DEPARTMENT_OTHER)
Admission: EM | Admit: 2021-10-07 | Discharge: 2021-10-07 | Disposition: A | Discharge status: Home / Self Care | Payer: BC Managed Care – PPO | Attending: Emergency Medicine | Admitting: Emergency Medicine

## 2021-10-07 ENCOUNTER — Encounter (HOSPITAL_BASED_OUTPATIENT_CLINIC_OR_DEPARTMENT_OTHER): Payer: Self-pay | Admitting: Emergency Medicine

## 2021-10-07 ENCOUNTER — Other Ambulatory Visit: Payer: Self-pay

## 2021-10-07 DIAGNOSIS — K0889 Other specified disorders of teeth and supporting structures: Secondary | ICD-10-CM | POA: Insufficient documentation

## 2021-10-07 MED ORDER — LIDOCAINE-EPINEPHRINE 2 %-1:100000 IJ SOLN
1.7000 mL | Freq: Once | INTRAMUSCULAR | Status: AC
  Administered 2021-10-07: 1.7 mL via INTRADERMAL

## 2021-10-07 NOTE — ED Provider Notes (Signed)
?[facility] EMERGENCY DEPT ?Provider Note ? ?CSN: [id number] ?Arrival date & time: 10/07/21 0435 ? ?Chief Complaint(s) ?Dental Problem ? ?HPI ?Jose Stevenson is a 31 y.o. male   ? ?The history is provided by the patient.  ?Dental Pain ?Location:  Lower ?Lower teeth location:  18/LL 2nd molar ?Quality:  Aching ?Severity:  Severe ?Onset quality:  Gradual ?Duration:  3 months (worse in the last 2-4 days) ?Timing:  Constant ?Progression:  Worsening ?Chronicity:  New ?Context: dental caries   ?Context: not abscess, not crown fracture, not dental fracture, not malocclusion, normal dentition and not trauma   ?Previous work-up:  Dental exam ?Worsened by:  Jaw movement and pressure ?Ineffective treatments:  NSAIDs and acetaminophen ?Associated symptoms: facial pain   ?Associated symptoms: no congestion, no drooling, no facial swelling, no fever, no gum swelling, no neck pain, no oral bleeding, no oral lesions and no trismus   ? ?Past Medical History ?Past Medical History:  ?Diagnosis Date  ? Anxiety   ? Depression   ? PTSD (post-traumatic stress disorder)   ? ?Patient Active Problem List  ? Diagnosis Date Noted  ? Marijuana dependence (HCC) 03/14/2020  ? Generalized anxiety disorder 03/14/2020  ? 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 Borderline personality disorder (HCC) 03/13/2020  ? ?Home Medication(s) ?Prior to Admission medications   ?Medication Sig Start Date End Date Taking? Authorizing Provider  ?amoxicillin (AMOXIL) 500 MG capsule Take 1 tablet every 8 hours until finish 08/18/21     ?chlorhexidine (PERIDEX) 0.12 % solution Swish for one minute and expectorate twice daily for 10 days. 08/18/21     ?gabapentin (NEURONTIN) 300 MG capsule Take 1 capsule (300 mg total) by mouth 3 (three) times daily. 09/13/21   Shanna Cisco, NP  ?hydrOXYzine (ATARAX) 25 MG tablet Take 1 tablet (25 mg total) by mouth 3 (three) times daily as needed. 09/13/21   Shanna Cisco, NP  ?ibuprofen (ADVIL) 800 MG tablet Take 1 tablet every 8 hours as needed for pain 08/18/21     ?lithium carbonate (LITHOBID) 300 MG CR tablet Take 1 tablet (300 mg total) by mouth 2 (two) times daily. 09/13/21   Shanna Cisco, NP  ?traZODone (DESYREL) 50 MG tablet TAKE 1 TABLET (50 MG TOTAL) BY MOUTH AT BEDTIME AS NEEDED FOR SLEEP. 09/13/21   Shanna Cisco, NP  ?vortioxetine HBr (TRINTELLIX) 20 MG TABS tablet Take 1 tablet (20 mg total) by mouth daily. 09/13/21   Shanna Cisco, NP  ?risperiDONE (RISPERDAL) 1 MG tablet Take 1 tablet (1 mg total) by mouth at bedtime. For mood control 04/08/20 06/10/20  Shanna Cisco, NP  ?                                                                                                                                  ?Allergies ?Patient has no known allergies. ? ?Review of Systems ?Review  of Systems  ?Constitutional:  Negative for fever.  ?HENT:  Negative for congestion, drooling, facial swelling and mouth sores.   ?Musculoskeletal:  Negative for neck pain.  ?As noted in HPI ? ?Physical Exam ?Vital Signs  ?I have reviewed the triage vital signs ?BP (!) 137/93 (BP Location: Right Arm)   Pulse (!) 59   Temp 98.4 ?F (36.9 ?C) (Oral)   Resp 18   Wt 72.6 kg   SpO2 100%   BMI 21.11 kg/m?  ? ?Physical Exam ?HENT:  ?   Mouth/Throat:  ?   Dentition: Dental tenderness present. No dental caries.  ? ? ? ?ED Results and Treatments ?Labs ?(all labs ordered are listed, but only abnormal results are displayed) ?Labs Reviewed - No data to display                                                                                                                       ?EKG ? EKG Interpretation ? ?Date/Time:    ?Ventricular Rate:    ?PR Interval:    ?QRS Duration:   ?QT Interval:    ?QTC Calculation:   ?R Axis:     ?Text Interpretation:   ?  ? ?  ? ?Radiology ?No results found. ? ?Pertinent labs & imaging results that were available during my care of the patient were  reviewed by me and considered in my medical decision making (see MDM for details). ? ?Medications Ordered in ED ?Medications  ?lidocaine-EPINEPHrine (XYLOCAINE W/EPI) 2 %-1:100000 (with pres) injection 1.7 mL (has no administration in time range)  ?                                                               ?                                                                    ?Procedures ?Dental Block ? ?Date/Time: 10/07/2021 5:03 AM ?Performed by: Nira Connardama, Niala Stcharles Eduardo, MD ?Authorized by: Nira Connardama, Merril Isakson Eduardo, MD  ? ?Consent:  ?  Consent obtained:  Verbal ?  Consent given by:  Patient ?  Risks discussed:  Allergic reaction, infection, intravascular injection and hematoma ?  Alternatives discussed:  Alternative treatment ?Universal protocol:  ?  Procedure explained and questions answered to patient or proxy's satisfaction: yes   ?  Patient identity confirmed:  Verbally with patient ?Indications:  ?  Indications: dental pain   ?Location:  ?  Block type:  Inferior alveolar ?  Laterality:  Left ?Procedure details:  ?  Needle  gauge:  27 G ?  Anesthetic injected:  Lidocaine 2% WITH epi ?Post-procedure details:  ?  Outcome:  Pain improved ?  Procedure completion:  Tolerated ? ?(including critical care time) ? ?Medical Decision Making / ED Course ? ? ? Complexity of Problem: ? ?Co-morbidities/SDOH that complicate the patient evaluation/care: ?N/a ? ?Additional history obtained: ?none ? ?Patient's presenting problem/concern and DDX listed below: ?Dental pain ?No evidence of abscess or dental decay concerning for exposed root. ? ? ?  Complexity of Data: ?  ?Cardiac Monitoring: ?none ? ?Laboratory Tests ordered listed below with my independent interpretation: ?none ?  ?Imaging Studies ordered listed below with my independent interpretation: ?none ?  ?  ?ED Course:   ? ?Hospitalization Considered:  ?no ? ?Assessment, Intervention, and Reassessment: ?Dental pain ?No evidence of infection. ?Dental block for pain relief ?He  already has dentist to follow up with.  ? ? ?Final Clinical Impression(s) / ED Diagnoses ?Final diagnoses:  ?Pain, dental  ? ?The patient appears reasonably screened and/or stabilized for discharge and I doubt any other medical condition or other [facility] requiring further screening, evaluation, or treatment in the ED at this time prior to discharge. Safe for discharge with strict return precautions. ? ?Disposition: Discharge ? ?Condition: Good ? ?I have discussed the results, Dx and Tx plan with the patient/family who expressed understanding and agree(s) with the plan. Discharge instructions discussed at length. The patient/family was given strict return precautions who verbalized understanding of the instructions. No further questions at time of discharge.  ? ? ?ED Discharge Orders   ? ? None  ? ?  ? ? ? ? ?Follow Up: ?Dentist ? ?Call  ?to schedule an appointment for close follow up ? ? ? ?  ? ? ? ? ? ?This chart was dictated using voice recognition software.  Despite best efforts to proofread,  errors can occur which can change the documentation meaning. ? ?  ?Nira Conn, MD ?10/07/21 [phone] ? ?

## 2021-10-07 NOTE — ED Triage Notes (Signed)
Presents from home for L lower dental pain with facial pain, difficulty chewing and sleep d/t pain x 4 days. Has tried tylenol and goody's powder without relief.  ? ?

## 2021-10-10 ENCOUNTER — Other Ambulatory Visit (HOSPITAL_COMMUNITY): Payer: Self-pay

## 2021-10-10 MED ORDER — AMOXICILLIN 500 MG PO CAPS
500.0000 mg | ORAL_CAPSULE | Freq: Four times a day (QID) | ORAL | 0 refills | Status: DC
  Filled 2021-10-10: qty 28, 7d supply, fill #0

## 2021-10-11 ENCOUNTER — Other Ambulatory Visit: Payer: Self-pay

## 2021-10-11 ENCOUNTER — Other Ambulatory Visit (HOSPITAL_COMMUNITY): Payer: Self-pay

## 2021-10-11 MED ORDER — ACETAMINOPHEN-CODEINE #3 300-30 MG PO TABS
1.0000 | ORAL_TABLET | Freq: Four times a day (QID) | ORAL | 0 refills | Status: DC | PRN
  Filled 2021-10-11: qty 15, 4d supply, fill #0

## 2021-10-31 ENCOUNTER — Ambulatory Visit (HOSPITAL_COMMUNITY): Payer: Self-pay | Admitting: Licensed Clinical Social Worker

## 2021-10-31 ENCOUNTER — Other Ambulatory Visit: Payer: Self-pay

## 2021-11-01 ENCOUNTER — Other Ambulatory Visit: Payer: Self-pay

## 2021-11-02 ENCOUNTER — Other Ambulatory Visit: Payer: Self-pay

## 2021-11-07 ENCOUNTER — Other Ambulatory Visit: Payer: Self-pay

## 2021-11-18 ENCOUNTER — Ambulatory Visit: Payer: BC Managed Care – PPO | Attending: Internal Medicine | Admitting: Internal Medicine

## 2021-11-18 ENCOUNTER — Encounter: Payer: Self-pay | Admitting: Internal Medicine

## 2021-11-18 VITALS — BP 125/87 | HR 86 | Ht 73.0 in | Wt 166.8 lb

## 2021-11-18 DIAGNOSIS — Z9189 Other specified personal risk factors, not elsewhere classified: Secondary | ICD-10-CM

## 2021-11-18 DIAGNOSIS — Z7689 Persons encountering health services in other specified circumstances: Secondary | ICD-10-CM | POA: Diagnosis not present

## 2021-11-18 DIAGNOSIS — K921 Melena: Secondary | ICD-10-CM

## 2021-11-18 DIAGNOSIS — F1020 Alcohol dependence, uncomplicated: Secondary | ICD-10-CM | POA: Diagnosis not present

## 2021-11-18 DIAGNOSIS — R030 Elevated blood-pressure reading, without diagnosis of hypertension: Secondary | ICD-10-CM

## 2021-11-18 DIAGNOSIS — Z113 Encounter for screening for infections with a predominantly sexual mode of transmission: Secondary | ICD-10-CM

## 2021-11-18 DIAGNOSIS — F1220 Cannabis dependence, uncomplicated: Secondary | ICD-10-CM

## 2021-11-18 DIAGNOSIS — R002 Palpitations: Secondary | ICD-10-CM

## 2021-11-18 DIAGNOSIS — R079 Chest pain, unspecified: Secondary | ICD-10-CM | POA: Diagnosis not present

## 2021-11-18 NOTE — Progress Notes (Signed)
STD testing ?Abdominal pain ?Left arm tingling. ?

## 2021-11-18 NOTE — Progress Notes (Addendum)
? ? ?Patient ID: Jose Stevenson, male    DOB: [birth year]  MRN: [id number] ? ?CC: New Patient (Initial Visit) ? ? ?Subjective: ?Jose Stevenson is a 31 y.o. male who presents for new pt visit ?His concerns today include:  ?Pt with hx of GAD, MDD, borderline personality, marijuana user ? ?No previous PCP.  Last saw a PCP 10 yrs ago ? ?Request STI screening.  ?No symptoms but reports he gets check Q 3 mths.  No penile rash, dischg or dysuria at this time. Has had 5 partners in the past 1 yr mostly male partners.  When he starts a new relationship, he uses condoms for the first 2 sexual encounters. If it becomes an exclusive relationship, he does not continue to use condoms.  Would like to get on PreP therapy.   ? ?C/o intermittent aches in LT lateral chest followed by weird beating of the heart. Intermittent x 6 mths.  Occurs about 2 x a mth and last 1-2 days going and coming.  Occurs predominately at nights.  Not sure if it goes down arm but gets a pressure feeling in upper LT arm and tingling with these episodes.  Feels it is triggered by stress.  "I stress a lot."  Does not exercise, sedentary job working from home and eats mainly take [employer].   ?-not sure of  fhx as he was adopted.  ?-smokes marijuana 5 x a day.  Feels it helps with his anxiety and "gives me the results I like."  No tobacco products.    ?-drinks 1 bottle of l liquor or wine in 2 days.  Wants to cut back.  Feels things would get better for him if he is able to get his mental health issues under control.  Currently plugged into behavioral health services and has diagnosis of GAD, MDD, borderline personality.  Feels he drinks too much but not sure if he is an alcoholic.  Starts drinking 9-10 a.m in the mornings.  Had 1 DWI in college.  Never did a treatment program ? ?C/o blood in stools x 2  2 wks ago.  Blood was mixed in with stool and in toilet.   ?Stools have been stringy/thin lately which is new for him ?Has to strain at times.   ?No dischg  from rectum or rectal pain. ?No vomiting.  Nausea from time to time.  ?No unexplained wgh loss ? ?Blood pressure noted to be above normal.  No prior history of hypertension. ?Patient Active Problem List  ? Diagnosis Date Noted  ? Marijuana dependence (HCC) 03/14/2020  ? Generalized anxiety disorder 03/14/2020  ? 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 Borderline personality disorder (HCC) 03/13/2020  ?  ? ?Current Outpatient Medications on File Prior to Visit  ?Medication Sig Dispense Refill  ? amoxicillin (AMOXIL) 500 MG capsule Take 1 capsule (500 mg total) by mouth 4 (four) times daily until complete 28 capsule 0  ? chlorhexidine (PERIDEX) 0.12 % solution Swish for one minute and expectorate twice daily for 10 days. 473 mL 0  ? gabapentin (NEURONTIN) 300 MG capsule Take 1 capsule (300 mg total) by mouth 3 (three) times daily. 90 capsule 3  ? hydrOXYzine (ATARAX) 25 MG tablet Take 1 tablet (25 mg total) by mouth 3 (three) times daily as needed. 90 tablet 3  ? ibuprofen (ADVIL) 800 MG tablet Take 1 tablet every 8 hours as needed for pain 20 tablet 0  ? lithium carbonate (LITHOBID) 300  MG CR tablet Take 1 tablet (300 mg total) by mouth 2 (two) times daily. 60 tablet 3  ? traZODone (DESYREL) 50 MG tablet TAKE 1 TABLET (50 MG TOTAL) BY MOUTH AT BEDTIME AS NEEDED FOR SLEEP. 30 tablet 3  ? acetaminophen-codeine (TYLENOL #3) 300-30 MG tablet Take 1 tablet by mouth every 6 (six) hours as needed for pain (Patient not taking: Reported on 11/18/2021) 15 tablet 0  ? vortioxetine HBr (TRINTELLIX) 20 MG TABS tablet Take 1 tablet (20 mg total) by mouth daily. (Patient not taking: Reported on 11/18/2021) 30 tablet 3  ? [DISCONTINUED] risperiDONE (RISPERDAL) 1 MG tablet Take 1 tablet (1 mg total) by mouth at bedtime. For mood control 30 tablet 2  ? ?No current facility-administered medications on file prior to visit.  ? ? ?No Known Allergies ? ?Social History  ? ?Socioeconomic History   ? Marital status: Single  ?  Spouse name: Not on file  ? Number of children: 0  ? Years of education: Not on file  ? Highest education level: Not on file  ?Occupational History  ? Not on file  ?Tobacco Use  ? Smoking status: Never  ? Smokeless tobacco: Never  ?Substance and Sexual Activity  ? Alcohol use: Yes  ?  Comment: 1 bottle of champagne daily  ? Drug use: Yes  ?  Frequency: 3.0 times per week  ?  Types: Marijuana  ? Sexual activity: Yes  ?Other Topics Concern  ? Not on file  ?Social History Narrative  ? Not on file  ? ?Social Determinants of Health  ? ?Financial Resource Strain: Not on file  ?Food Insecurity: Not on file  ?Transportation Needs: Not on file  ?Physical Activity: Not on file  ?Stress: Not on file  ?Social Connections: Not on file  ?Intimate Partner Violence: Not on file  ? ? ?History reviewed. No pertinent family history. ? ?Past Surgical History:  ?Procedure Laterality Date  ? NO PAST SURGERIES    ? ? ?ROS: ?Review of Systems ?Negative except as stated above ? ?PHYSICAL EXAM: ?BP 125/87   Pulse 86   Ht 6\' 1"  (1.854 m)   Wt 166 lb 12.8 oz (75.7 kg)   SpO2 99%   BMI 22.01 kg/m?   ?Physical Exam ? ? ?General appearance - alert, well appearing, and in no distress ?Mental status - normal mood, behavior, speech, dress, motor activity, and thought processes ?Eyes - pupils equal and reactive, extraocular eye movements intact ?Mouth - mucous membranes moist, pharynx normal without lesions ?Chest - clear to auscultation, no wheezes, rales or rhonchi, symmetric air entry ?Heart - normal rate, regular rhythm, normal S1, S2, no murmurs, rubs, clicks or gallops ?Abdomen - soft, nontender, nondistended, no masses or organomegaly ?Rectal -CMA Carly present for rectal exam.  No external hemorrhoids or rectal lesions noted.  No hemorrhoids or masses protrudes out of the rectum and patient was asked to bear down. ?Extremities - peripheral pulses normal, no pedal edema, no clubbing or cyanosis ? ? ?   Latest Ref Rng & Units 03/17/2020  ?  6:25 AM 03/13/2020  ?  2:30 PM  ?CMP  ?Glucose 70 - 99 mg/dL 84   78    ?BUN 6 - 20 mg/dL 14   13    ?Creatinine 0.61 - 1.24 mg/dL 03/15/2020   7.16    ?Sodium 135 - 145 mmol/L 138   138    ?Potassium 3.5 - 5.1 mmol/L 4.5   4.5    ?Chloride  98 - 111 mmol/L 102   103    ?CO2 22 - 32 mmol/L 24   25    ?Calcium 8.9 - 10.3 mg/dL 9.7   9.4    ?Total Protein 6.5 - 8.1 g/dL  7.7    ?Total Bilirubin 0.3 - 1.2 mg/dL  0.8    ?Alkaline Phos 38 - 126 U/L  58    ?AST 15 - 41 U/L  22    ?ALT 0 - 44 U/L  20    ? ?Lipid Panel  ?   ?Component Value Date/Time  ? CHOL 173 03/13/2020 1430  ? TRIG 77 03/13/2020 1430  ? HDL 42 03/13/2020 1430  ? CHOLHDL 4.1 03/13/2020 1430  ? VLDL 15 03/13/2020 1430  ? LDLCALC 116 (H) 03/13/2020 1430  ? ? ?CBC ?   ?Component Value Date/Time  ? WBC 5.1 03/13/2020 1430  ? RBC 5.12 03/13/2020 1430  ? HGB 14.1 03/13/2020 1430  ? HCT 44.6 03/13/2020 1430  ? PLT 368 03/13/2020 1430  ? MCV 87.1 03/13/2020 1430  ? MCH 27.5 03/13/2020 1430  ? MCHC 31.6 03/13/2020 1430  ? RDW 13.2 03/13/2020 1430  ? LYMPHSABS 1.7 03/13/2020 1430  ? MONOABS 0.7 03/13/2020 1430  ? EOSABS 0.1 03/13/2020 1430  ? BASOSABS 0.0 03/13/2020 1430  ? ? ?ASSESSMENT AND PLAN: ?1. Establishing care with new doctor, encounter for ? ? ?2. Chest pain in adult ?Unlikely cardiac.  However given that it is associated with palpitations and pressure in the left arm, will refer to cardiology.  Likely contributing factors are excessive daily marijuana and alcohol use  ?- Ambulatory referral to Cardiology ?- Lipid panel ? ?3. Palpitations ?TSH in 2021 was normal.  We will recheck TSH. ?Likely contributing factors are same as stated in #2 above. ?- Ambulatory referral to Cardiology ? ?4. Blood in the stool ?No hemorrhoids noted on exam.  Cannot rule out internal hemorrhoids.  Recommend increasing fiber in the diet by eating more fresh fruits and vegetables daily and drinking several glasses of water daily.  He has had some  change in his bowel habits and that stools are thin and for this reason we will refer him to the gastroenterologist. ?- Ambulatory referral to Gastroenterology ?- CBC ? ?5. Screen for STD (sexually transmitted di

## 2021-11-21 ENCOUNTER — Telehealth: Payer: Self-pay

## 2021-11-21 LAB — CBC
Hematocrit: 42.2 % (ref 37.5–51.0)
Hemoglobin: 14.2 g/dL (ref 13.0–17.7)
MCH: 28.9 pg (ref 26.6–33.0)
MCHC: 33.6 g/dL (ref 31.5–35.7)
MCV: 86 fL (ref 79–97)
Platelets: 347 10*3/uL (ref 150–450)
RBC: 4.91 x10E6/uL (ref 4.14–5.80)
RDW: 13.3 % (ref 11.6–15.4)
WBC: 5.7 10*3/uL (ref 3.4–10.8)

## 2021-11-21 LAB — COMPREHENSIVE METABOLIC PANEL
ALT: 8 IU/L (ref 0–44)
AST: 13 IU/L (ref 0–40)
Albumin/Globulin Ratio: 1.8 (ref 1.2–2.2)
Albumin: 4.8 g/dL (ref 4.1–5.2)
Alkaline Phosphatase: 83 IU/L (ref 44–121)
BUN/Creatinine Ratio: 10 (ref 9–20)
BUN: 13 mg/dL (ref 6–20)
Bilirubin Total: 0.4 mg/dL (ref 0.0–1.2)
CO2: 26 mmol/L (ref 20–29)
Calcium: 9.7 mg/dL (ref 8.7–10.2)
Chloride: 106 mmol/L (ref 96–106)
Creatinine, Ser: 1.26 mg/dL (ref 0.76–1.27)
Globulin, Total: 2.7 g/dL (ref 1.5–4.5)
Glucose: 65 mg/dL — ABNORMAL LOW (ref 70–99)
Potassium: 4.1 mmol/L (ref 3.5–5.2)
Sodium: 144 mmol/L (ref 134–144)
Total Protein: 7.5 g/dL (ref 6.0–8.5)
eGFR: 80 mL/min/{1.73_m2} (ref >59)

## 2021-11-21 LAB — LIPID PANEL
Chol/HDL Ratio: 2.9 ratio (ref 0.0–5.0)
Cholesterol, Total: 153 mg/dL (ref 100–199)
HDL: 53 mg/dL (ref >39)
LDL Chol Calc (NIH): 84 mg/dL (ref 0–99)
Triglycerides: 82 mg/dL (ref 0–149)
VLDL Cholesterol Cal: 16 mg/dL (ref 5–40)

## 2021-11-21 LAB — HIV ANTIBODY (ROUTINE TESTING W REFLEX): HIV Screen 4th Generation wRfx: NONREACTIVE

## 2021-11-21 LAB — T PALLIDUM ANTIBODY, EIA: T pallidum Antibody, EIA: NEGATIVE

## 2021-11-21 LAB — HEPATITIS C ANTIBODY: Hep C Virus Ab: NONREACTIVE

## 2021-11-21 LAB — RPR W/REFLEX TO TREPSURE: RPR: NONREACTIVE

## 2021-11-21 NOTE — Telephone Encounter (Signed)
Contacted pt to go over lab results pt is aware and doesn't have any questions or concerns 

## 2021-11-23 ENCOUNTER — Telehealth: Payer: Self-pay

## 2021-11-23 ENCOUNTER — Other Ambulatory Visit (HOSPITAL_COMMUNITY): Payer: Self-pay

## 2021-11-23 NOTE — Telephone Encounter (Signed)
[facility] Patient Advocate Encounter ? ?Insurance verification completed.   ? ?The patient is insured through [employer]. ? ?[employer] $150.00 will need a copay card. ?[employer] $0.00 ?[employer] will need a PA  ? ?We will continue to follow to see if copay assistance is needed. ? ?Clearance Coots, CPhT ?Specialty Pharmacy Patient Advocate ?[facility] ?Phone: [phone] ?Fax:  [phone]  ?

## 2021-11-24 ENCOUNTER — Ambulatory Visit: Payer: BC Managed Care – PPO | Admitting: Pharmacist

## 2021-11-24 ENCOUNTER — Ambulatory Visit: Payer: BC Managed Care – PPO | Admitting: Cardiovascular Disease

## 2021-11-25 ENCOUNTER — Telehealth: Payer: Self-pay | Admitting: Internal Medicine

## 2021-11-25 NOTE — Telephone Encounter (Signed)
Pt is calling to ask can another referral be sent to a different GI office. [facility] does not have an immediate appts. Please advise  ?

## 2021-11-25 NOTE — Telephone Encounter (Signed)
Copied from [place]. Topic: Referral - Status ?>> Nov 25, 2021 12:36 PM Yvette Rack wrote: ?Reason for CRM: Pt stated he needs a new referral for GI because the location that he was referred to does not have any appts soon enough. Pt requests that a new referral be placed to a different location. ?

## 2021-11-26 ENCOUNTER — Emergency Department (HOSPITAL_BASED_OUTPATIENT_CLINIC_OR_DEPARTMENT_OTHER)
Admission: EM | Admit: 2021-11-26 | Discharge: 2021-11-26 | Disposition: A | Discharge status: Home / Self Care | Payer: BC Managed Care – PPO | Attending: Emergency Medicine | Admitting: Emergency Medicine

## 2021-11-26 ENCOUNTER — Emergency Department (HOSPITAL_BASED_OUTPATIENT_CLINIC_OR_DEPARTMENT_OTHER): Payer: BC Managed Care – PPO | Admitting: Radiology

## 2021-11-26 DIAGNOSIS — R1032 Left lower quadrant pain: Secondary | ICD-10-CM | POA: Diagnosis not present

## 2021-11-26 DIAGNOSIS — R1084 Generalized abdominal pain: Secondary | ICD-10-CM

## 2021-11-26 DIAGNOSIS — K5900 Constipation, unspecified: Secondary | ICD-10-CM | POA: Diagnosis not present

## 2021-11-26 DIAGNOSIS — R109 Unspecified abdominal pain: Secondary | ICD-10-CM | POA: Diagnosis not present

## 2021-11-26 LAB — URINALYSIS, ROUTINE W REFLEX MICROSCOPIC
Bilirubin Urine: NEGATIVE
Glucose, UA: NEGATIVE mg/dL
Hgb urine dipstick: NEGATIVE
Ketones, ur: NEGATIVE mg/dL
Leukocytes,Ua: NEGATIVE
Nitrite: NEGATIVE
Protein, ur: NEGATIVE mg/dL
Specific Gravity, Urine: 1.016 (ref 1.005–1.030)
pH: 7 (ref 5.0–8.0)

## 2021-11-26 LAB — TSH: TSH: 2.08 u[IU]/mL (ref 0.450–4.500)

## 2021-11-26 LAB — SPECIMEN STATUS REPORT

## 2021-11-26 MED ORDER — POLYETHYLENE GLYCOL 3350 17 G PO PACK: 17.0000 g | PACK | Freq: Every day | ORAL | 0 refills | Status: DC | PRN

## 2021-11-26 MED ORDER — DICYCLOMINE HCL 20 MG PO TABS: 20.0000 mg | ORAL_TABLET | Freq: Three times a day (TID) | ORAL | 0 refills | Status: DC | PRN

## 2021-11-26 NOTE — ED Notes (Signed)
Discharge instructions, follow up care, and prescriptions reviewed and explained, pt verbalized understanding.  

## 2021-11-26 NOTE — Discharge Instructions (Signed)
You are seen in the emergency room today with abdominal discomfort.  Your x-ray and urine analysis are reassuring.  I am starting medications to help with symptoms including Bentyl which you take as needed for abdominal discomfort.  I have also prescribed MiraLAX to take as needed for constipation.  You should be aiming to have at least 1 soft bowel movement per day.  Follow-up with your GI doctor as scheduled return with any new or suddenly worsening symptoms. ?

## 2021-11-26 NOTE — ED Triage Notes (Signed)
Pt from home c/o sharp left lower abd pain/ constipation x 1 week. Was seen at PCP and was referred to see GI but appt won't be until May 23rd, states "they checked for hemorrhoid but they didn't see any". Last BM yesterday, normal stool, no blood. Denies n/v/d. ?

## 2021-11-26 NOTE — ED Provider Notes (Signed)
? ?Emergency Department Provider Note ? ? ?I have reviewed the triage vital signs and the nursing notes. ? ? ?HISTORY ? ?Chief Complaint ?Constipation ? ? ?HPI ?Jose Stevenson is a 31 y.o. male presents to the ED with LLQ abdominal pain and constipation. Pain is mild to moderate. He has been seen by PCP and referred to GI with appointment later this month. No fever. No UTI symptoms. No CP. No blood in BM. Has had several soft BMs recently. No radiation of symptoms or modifying factors.  ? ? ?Past Medical History:  ?Diagnosis Date  ? Anxiety   ? Depression   ? PTSD (post-traumatic stress disorder)   ? ? ?Review of Systems ? ?Constitutional: No fever/chills ?Eyes: No visual changes. ?ENT: No sore throat. ?Cardiovascular: Denies chest pain. ?Respiratory: Denies shortness of breath. ?Gastrointestinal: Positive left sided abdominal pain.  No nausea, no vomiting.  No diarrhea.  No constipation. ?Genitourinary: Negative for dysuria. ?Musculoskeletal: Negative for back pain. ?Skin: Negative for rash. ?Neurological: Negative for headaches. ? ? ?____________________________________________ ? ? ?PHYSICAL EXAM: ? ?VITAL SIGNS: ?ED Triage Vitals [11/26/21 0918]  ?Enc Vitals Group  ?   BP 126/81  ?   Pulse Rate 75  ?   Resp 15  ?   Temp 98.4 ?F (36.9 ?C)  ?   Temp Source Oral  ?   SpO2 100 %  ?   Weight 165 lb (74.8 kg)  ?   Height 6\' 1"  (1.854 m)  ? ?Constitutional: Alert and oriented. Well appearing and in no acute distress. ?Eyes: Conjunctivae are normal.  ?Head: Atraumatic. ?Nose: No congestion/rhinnorhea. ?Mouth/Throat: Mucous membranes are moist.  ?Neck: No stridor.   ?Cardiovascular: Normal rate, regular rhythm. Good peripheral circulation. Grossly normal heart sounds.   ?Respiratory: Normal respiratory effort.  No retractions. Lungs CTAB. ?Gastrointestinal: Soft and nontender. No distention.  ?Musculoskeletal:  No gross deformities of extremities. ?Neurologic:  Normal speech and language.  ?Skin:  Skin is warm, dry and  intact. No rash noted. ? ?____________________________________________ ?  ?LABS ?(all labs ordered are listed, but only abnormal results are displayed) ? ?Labs Reviewed  ?URINALYSIS, ROUTINE W REFLEX MICROSCOPIC  ? ?____________________________________________ ? ? ?PROCEDURES ? ?Procedure(s) performed:  ? ?Procedures ? ?None ?____________________________________________ ? ? ?INITIAL IMPRESSION / ASSESSMENT AND PLAN / ED COURSE ? ?Pertinent labs & imaging results that were available during my care of the patient were reviewed by me and considered in my medical decision making (see chart for details). ? ?  ?Clinical Laboratory Tests Ordered, included UA without infection.  ? ?Radiologic Tests Ordered, included abdominal plain films. I independently interpreted the images and agree with radiology interpretation.  ? ?Medical Decision Making: Summary:  ?Patient with left sided abdominal pain. No testicle discomfort. No UTI on UA here. Considered CT imaging but patient's abdomen is overall non-tender. Plain films reviewed. Will defer CT imaging and treat symptoms with plan for GI follow up. Patient is comfortable with the plan at discharge.  ? ?Disposition: discharge ? ?____________________________________________ ? ?FINAL CLINICAL IMPRESSION(S) / ED DIAGNOSES ? ?Final diagnoses:  ?Generalized abdominal pain  ? ? ? ?NEW OUTPATIENT MEDICATIONS STARTED DURING THIS VISIT: ? ?Discharge Medication List as of 11/26/2021 10:39 AM  ?  ? ?START taking these medications  ? Details  ?dicyclomine (BENTYL) 20 MG tablet Take 1 tablet (20 mg total) by mouth 3 (three) times daily as needed for spasms., Starting Sat 11/26/2021, Print  ?  ?polyethylene glycol (MIRALAX) 17 g packet Take 17 g by mouth daily  as needed for mild constipation or moderate constipation., Starting Sat 11/26/2021, Print  ?  ?  ? ? ?Note:  This document was prepared using Dragon voice recognition software and may include unintentional dictation errors. ? ?Nanda Quinton, MD,  FACEP ?Emergency Medicine ? ?  ?Margette Fast, MD ?11/29/21 [phone] ? ?

## 2021-11-28 ENCOUNTER — Telehealth (HOSPITAL_COMMUNITY): Payer: No Payment, Other | Admitting: Psychiatry

## 2021-11-29 ENCOUNTER — Ambulatory Visit: Payer: BC Managed Care – PPO | Admitting: Cardiovascular Disease

## 2021-11-29 ENCOUNTER — Encounter: Payer: Self-pay | Admitting: Cardiovascular Disease

## 2021-11-29 DIAGNOSIS — F1220 Cannabis dependence, uncomplicated: Secondary | ICD-10-CM | POA: Diagnosis not present

## 2021-11-29 DIAGNOSIS — F1020 Alcohol dependence, uncomplicated: Secondary | ICD-10-CM

## 2021-11-29 DIAGNOSIS — R002 Palpitations: Secondary | ICD-10-CM | POA: Diagnosis not present

## 2021-11-29 DIAGNOSIS — R0789 Other chest pain: Secondary | ICD-10-CM

## 2021-11-29 DIAGNOSIS — F411 Generalized anxiety disorder: Secondary | ICD-10-CM

## 2021-11-29 NOTE — Patient Instructions (Signed)
Medication Instructions:  ?No changes  ? ?*If you need a refill on your cardiac medications before your next appointment, please call your pharmacy* ? ? ?Lab Work: ?Not  needed ? ? ? ?Testing/Procedures: ?Will be schedule at [address] ?Your physician has requested that you have an echocardiogram. Echocardiography is a painless test that uses sound waves to create images of your heart. It provides your doctor with information about the size and shape of your heart and how well your heart?s chambers and valves are working. This procedure takes approximately one hour. There are no restrictions for this procedure. ? ? ? ?Follow-Up: ?At [facility], you and your health needs are our priority.  As part of our continuing mission to provide you with exceptional heart care, we have created designated Provider Care Teams.  These Care Teams include your primary Cardiologist (physician) and Advanced Practice Providers (APPs -  Physician Assistants and Nurse Practitioners) who all work together to provide you with the care you need, when you need it. ? ?  ? ?Your next appointment:   ?  As needed ? ?The format for your next appointment:   ?In Person ? ?Provider:   ?Nicki Guadalajara, MD  ? ? ?Other Instructions  ?

## 2021-11-29 NOTE — Progress Notes (Signed)
? ?Cardiology Office Note   ? ?Date:  12/06/2021  ? ?ID:  Jose Stevenson, DOB Feb 12, 1991, MRN [id number] ? ?PCP:  Ladell Pier, MD  ?Cardiologist:  Shelva Majestic, MD  ? ?New cardiology consultation referred through the courtesy of Dr. Karle Plumber for atypical chest pain and palpitations. ? ? ?History of Present Illness:  ?Jose Stevenson is a 31 y.o. male who is followed by Dr. Karle Plumber at [facility].  He has a history of daily marijuana use, and admits to increased anxiety and stress.  He is being seen at behavioral health services with a diagnosis of GAD, MDD, border personality. ? ?Jose Stevenson admits to episodes of sharp chest aches and feels like there is pinching in the vein of his left arm.  The symptoms are not precipitated by exertion but often times he notes that he may tend to hyperventilate during periods of increased anxiety which may exacerbate the symptoms.  These have been occurring off and on for over 2 years and at times can last a half of a day.  He denies any exertional chest tightness or pressure.  He denies any associated shortness of breath or palpitations with this discomfort.   ? ?He has recently seen Karle Plumber on November 18, 2021.  He is adopted and does not know any of his family history.  He currently is taking gabapentin 300 mg 3 times a day, hydroxyzine as needed, lithium 300 mg 2 times day and Desyrel as needed for bedtime.  In addition to daily marijuana use for over 10 years he also drinks alcohol on a daily basis.  At times his blood pressure has been elevated and dietary adjustment with particular sugar and salt has been previously recommended. ? ? ?Past Medical History:  ?Diagnosis Date  ? Anxiety   ? Depression   ? PTSD (post-traumatic stress disorder)   ? ? ?Past Surgical History:  ?Procedure Laterality Date  ? NO PAST SURGERIES    ? ? ?Current Medications: ?Outpatient Medications Prior to Visit  ?Medication Sig Dispense Refill   ? gabapentin (NEURONTIN) 300 MG capsule Take 1 capsule (300 mg total) by mouth 3 (three) times daily. 90 capsule 3  ? hydrOXYzine (ATARAX) 25 MG tablet Take 1 tablet (25 mg total) by mouth 3 (three) times daily as needed. 90 tablet 3  ? lithium carbonate (LITHOBID) 300 MG CR tablet Take 1 tablet (300 mg total) by mouth 2 (two) times daily. 60 tablet 3  ? traZODone (DESYREL) 50 MG tablet TAKE 1 TABLET (50 MG TOTAL) BY MOUTH AT BEDTIME AS NEEDED FOR SLEEP. 30 tablet 3  ? amoxicillin (AMOXIL) 500 MG capsule Take 1 capsule (500 mg total) by mouth 4 (four) times daily until complete 28 capsule 0  ? acetaminophen-codeine (TYLENOL #3) 300-30 MG tablet Take 1 tablet by mouth every 6 (six) hours as needed for pain (Patient not taking: Reported on 11/18/2021) 15 tablet 0  ? chlorhexidine (PERIDEX) 0.12 % solution Swish for one minute and expectorate twice daily for 10 days. (Patient not taking: Reported on 11/29/2021) 473 mL 0  ? dicyclomine (BENTYL) 20 MG tablet Take 1 tablet (20 mg total) by mouth 3 (three) times daily as needed for spasms. (Patient not taking: Reported on 11/29/2021) 20 tablet 0  ? ibuprofen (ADVIL) 800 MG tablet Take 1 tablet every 8 hours as needed for pain (Patient not taking: Reported on 11/29/2021) 20 tablet 0  ? polyethylene glycol (MIRALAX) 17 g packet  Take 17 g by mouth daily as needed for mild constipation or moderate constipation. (Patient not taking: Reported on 11/29/2021) 14 each 0  ? vortioxetine HBr (TRINTELLIX) 20 MG TABS tablet Take 1 tablet (20 mg total) by mouth daily. (Patient not taking: Reported on 11/18/2021) 30 tablet 3  ? ?No facility-administered medications prior to visit.  ?  ? ?Allergies:   Patient has no known allergies.  ? ?Social History  ? ?Socioeconomic History  ? Marital status: Single  ?  Spouse name: Not on file  ? Number of children: 0  ? Years of education: Not on file  ? Highest education level: Not on file  ?Occupational History  ? Occupation: Wellsite geologist  ?   Employer: [employer]  ?Tobacco Use  ? Smoking status: Never  ? Smokeless tobacco: Never  ?Substance and Sexual Activity  ? Alcohol use: Yes  ?  Comment: 1 bottle of champagne daily  ? Drug use: Yes  ?  Frequency: 3.0 times per week  ?  Types: Marijuana  ? Sexual activity: Yes  ?Other Topics Concern  ? Not on file  ?Social History Narrative  ? Not on file  ? ?Social Determinants of Health  ? ?Financial Resource Strain: Not on file  ?Food Insecurity: Not on file  ?Transportation Needs: Not on file  ?Physical Activity: Not on file  ?Stress: Not on file  ?Social Connections: Not on file  ?  ?Socially, he is single.  He attended [employer] in [place] and has a degree in health administration. He has no children.  He has had male partners.  He is employed as a Research officer, trade union at [employer].  He admits to daily marijuana use for 10 years.  He drinks alcohol on a daily basis.  He exercises 2 days/week with a mild cardio work-up ? ?Family History: He is adopted.  He was born in [place] ? ?ROS ?General: Negative; No fevers, chills, or night sweats;  ?HEENT: Negative; No changes in vision or hearing, sinus congestion, difficulty swallowing ?Pulmonary: Negative; No cough, wheezing, shortness of breath, hemoptysis ?Cardiovascular: Atypical chest pain ?GI: Recently had noticed mild blood in his stool which he discussed with his primary physician. ?GU: Negative; No dysuria, hematuria, or difficulty voiding ?Musculoskeletal: Negative; no myalgias, joint pain, or weakness ?Hematologic/Oncology: Negative; no easy bruising, bleeding ?Endocrine: Negative; no heat/cold intolerance; no diabetes ?Neuro: Negative; no changes in balance, headaches ?Skin: Negative; No rashes or skin lesions ?Psychiatric: Negative; No behavioral problems, depression ?Sleep: Negative; No snoring, daytime sleepiness, hypersomnolence, bruxism, restless legs, hypnogognic hallucinations, no cataplexy ?Other comprehensive 14 point  system review is negative. ? ? ?PHYSICAL EXAM:   ?VS:  BP 100/80   Pulse 69   Ht _0  (1.854 m)   Wt 167 lb 12.8 oz (76.1 kg)   SpO2 97%   BMI 22.14 kg/m?    ? ?Repeat blood pressure by me was 120/84 ? ?Wt Readings from Last 3 Encounters:  ?11/29/21 167 lb 12.8 oz (76.1 kg)  ?11/26/21 165 lb (74.8 kg)  ?11/18/21 166 lb 12.8 oz (75.7 kg)  ?  ?General: Alert, oriented, no distress.  ?Skin: normal turgor, no rashes, warm and dry ?HEENT: Normocephalic, atraumatic. Pupils equal round and reactive to light; sclera anicteric; extraocular muscles intact;  ?Nose without nasal septal hypertrophy ?Mouth/Parynx benign; Mallinpatti scale 3 ?Neck: No JVD, no carotid bruits; normal carotid upstroke ?Lungs: clear to ausculatation and percussion; no wheezing or rales ?Chest wall: without tenderness to palpitation ?  Heart: PMI not displaced, RRR, s1 s2 normal, 1/6 systolic murmur, no diastolic murmur, no rubs, gallops, thrills, or heaves ?Abdomen: soft, nontender; no hepatosplenomehaly, BS+; abdominal aorta nontender and not dilated by palpation. ?Back: no CVA tenderness ?Pulses 2+ ?Musculoskeletal: full range of motion, normal strength, no joint deformities ?Extremities: no clubbing cyanosis or edema, Homan's sign negative  ?Neurologic: grossly nonfocal; Cranial nerves grossly wnl ?Psychologic: Normal mood and affect ? ? ?Studies/Labs Reviewed:  ? ?ECG (independently read by me): NSR at 70, early repolarization ? ?Recent Labs: ? ?  Latest Ref Rng & Units 11/18/2021  ?  3:33 PM 03/17/2020  ?  6:25 AM 03/13/2020  ?  2:30 PM  ?BMP  ?Glucose 70 - 99 mg/dL 65   84   78    ?BUN 6 - 20 mg/dL _0 ?Creatinine 0.76 - 1.27 mg/dL 1.26   1.33   1.17    ?BUN/Creat Ratio 9 - 20 10      ?Sodium 134 - 144 mmol/L 144   138   138    ?Potassium 3.5 - 5.2 mmol/L 4.1   4.5   4.5    ?Chloride 96 - 106 mmol/L 106   102   103    ?CO2 20 - 29 mmol/L _1 ?Calcium 8.7 - 10.2 mg/dL 9.7   9.7   9.4    ? ? ? ? ?  Latest Ref Rng & Units  11/18/2021  ?  3:33 PM 03/13/2020  ?  2:30 PM  ?Hepatic Function  ?Total Protein 6.0 - 8.5 g/dL 7.5   7.7    ?Albumin 4.1 - 5.2 g/dL 4.8   4.2    ?AST 0 - 40 IU/L 13   22    ?ALT 0 - 44 IU/L 8   20    ?Alk Phosph

## 2021-11-30 ENCOUNTER — Telehealth: Payer: Self-pay

## 2021-11-30 NOTE — Telephone Encounter (Signed)
Contacted pt to go over lab results pt is aware and doesn't have any questions or concerns 

## 2021-12-02 ENCOUNTER — Encounter (HOSPITAL_COMMUNITY): Payer: Self-pay

## 2021-12-02 ENCOUNTER — Encounter (HOSPITAL_COMMUNITY): Payer: Self-pay | Admitting: Cardiovascular Disease

## 2021-12-02 ENCOUNTER — Ambulatory Visit (HOSPITAL_COMMUNITY): Payer: BC Managed Care – PPO | Attending: Cardiology

## 2021-12-02 DIAGNOSIS — K625 Hemorrhage of anus and rectum: Secondary | ICD-10-CM | POA: Diagnosis not present

## 2021-12-02 NOTE — Progress Notes (Signed)
Verified appointment "no show" status with G. Andrews at 10:35.  ?

## 2021-12-06 ENCOUNTER — Encounter: Payer: Self-pay | Admitting: Cardiovascular Disease

## 2021-12-06 ENCOUNTER — Telehealth (INDEPENDENT_AMBULATORY_CARE_PROVIDER_SITE_OTHER): Payer: BC Managed Care – PPO | Admitting: Psychiatry

## 2021-12-06 DIAGNOSIS — F411 Generalized anxiety disorder: Secondary | ICD-10-CM | POA: Diagnosis not present

## 2021-12-06 NOTE — Progress Notes (Signed)
BH MD/PA/NP OP Progress Note ? ?12/06/2021 12:15 PM ?Jose Stevenson  ?MRN:  [id number] ? ?Virtual Visit via Video Note ? ?I connected with Jose Stevenson on 12/06/21 at 11:00 AM EDT by a video enabled telemedicine application and verified that I am speaking with the correct person using two identifiers. ? ?Location: ?Patient: home ?Provider: offsite ?  ?I discussed the limitations of evaluation and management by telemedicine and the availability of in person appointments. The patient expressed understanding and agreed to proceed. ? ? ?  ?I discussed the assessment and treatment plan with the patient. The patient was provided an opportunity to ask questions and all were answered. The patient agreed with the plan and demonstrated an understanding of the instructions. ?  ?The patient was advised to call back or seek an in-person evaluation if the symptoms worsen or if the condition fails to improve as anticipated. ? ?I provided 5 minutes of non-face-to-face time during this encounter. ? ? ?Mcneil Sober, NP  ? ? ?Chief Complaint: Medication management ? ?HPI: Jose Stevenson is a 31 year old male presenting to [facility] behavioral health outpatient for follow-up psychiatric evaluation.  Patient is being seen by this provider today as his attending psychiatric provider is unavailable to complete today's appointment.  He has a psychiatric history of marijuana dependence, generalized anxiety disorder, borderline personality disorder, alcohol use disorder, and major depressive disorder.  His symptoms are managed with gabapentin 300 mg 3 times daily, hydroxyzine 25 mg 3 times daily as needed for anxiety, lithium 300 mg twice daily and trazodone 50 mg at bedtime as needed for sleep.  Patient reports that he was previously taking Trintellix but he has not taken the medication in over 30 days due to medication side effects.  Patient stated "it made me feel funny".  Patient reports that his depressive symptoms have improved.   Patient reports medication effectiveness and compliance.  Patient denies adverse medication effects and the need for dosage adjustment today.  No medication changes today. ? ? ?Visit Diagnosis:  ?  ICD-10-CM   ?1. Generalized anxiety disorder  F41.1   ?  ? ? ?Past Psychiatric History: marijuana dependence, generalized anxiety disorder, borderline personality disorder, alcohol use disorder, and major depressive disorder ? ?Past Medical History:  ?Past Medical History:  ?Diagnosis Date  ? Anxiety   ? Depression   ? PTSD (post-traumatic stress disorder)   ?  ?Past Surgical History:  ?Procedure Laterality Date  ? NO PAST SURGERIES    ? ? ?Family Psychiatric History: n/a ? ?Family History: No family history on file. ? ?Social History:  ?Social History  ? ?Socioeconomic History  ? Marital status: Single  ?  Spouse name: Not on file  ? Number of children: 0  ? Years of education: Not on file  ? Highest education level: Not on file  ?Occupational History  ? Occupation: Science writer  ?  Employer: [employer]  ?Tobacco Use  ? Smoking status: Never  ? Smokeless tobacco: Never  ?Substance and Sexual Activity  ? Alcohol use: Yes  ?  Comment: 1 bottle of champagne daily  ? Drug use: Yes  ?  Frequency: 3.0 times per week  ?  Types: Marijuana  ? Sexual activity: Yes  ?Other Topics Concern  ? Not on file  ?Social History Narrative  ? Not on file  ? ?Social Determinants of Health  ? ?Financial Resource Strain: Not on file  ?Food Insecurity: Not on file  ?Transportation Needs: Not on file  ?Physical Activity: Not  on file  ?Stress: Not on file  ?Social Connections: Not on file  ? ? ?Allergies: No Known Allergies ? ?Metabolic Disorder Labs: ?Lab Results  ?Component Value Date  ? HGBA1C 4.7 (L) 03/13/2020  ? MPG 88.19 03/13/2020  ? ?No results found for: PROLACTIN ?Lab Results  ?Component Value Date  ? CHOL 153 11/18/2021  ? TRIG 82 11/18/2021  ? HDL 53 11/18/2021  ? CHOLHDL 2.9 11/18/2021  ? VLDL 15 03/13/2020  ? LDLCALC 84  11/18/2021  ? LDLCALC 116 (H) 03/13/2020  ? ?Lab Results  ?Component Value Date  ? TSH 2.080 11/18/2021  ? TSH 2.732 03/17/2020  ? ? ?Therapeutic Level Labs: ?Lab Results  ?Component Value Date  ? LITHIUM 0.44 (L) 03/17/2020  ? LITHIUM 0.56 (L) 03/13/2020  ? ?No results found for: VALPROATE ?No components found for:  CBMZ ? ?Current Medications: ?Current Outpatient Medications  ?Medication Sig Dispense Refill  ? gabapentin (NEURONTIN) 300 MG capsule Take 1 capsule (300 mg total) by mouth 3 (three) times daily. 90 capsule 3  ? hydrOXYzine (ATARAX) 25 MG tablet Take 1 tablet (25 mg total) by mouth 3 (three) times daily as needed. 90 tablet 3  ? lithium carbonate (LITHOBID) 300 MG CR tablet Take 1 tablet (300 mg total) by mouth 2 (two) times daily. 60 tablet 3  ? traZODone (DESYREL) 50 MG tablet TAKE 1 TABLET (50 MG TOTAL) BY MOUTH AT BEDTIME AS NEEDED FOR SLEEP. 30 tablet 3  ? ?No current facility-administered medications for this visit.  ? ? ? ?Musculoskeletal: ?Strength & Muscle Tone: n/a virtual visit ?Gait & Station: n/a ?Patient leans: N/A ? ?Psychiatric Specialty Exam: ?Review of Systems  ?Psychiatric/Behavioral:  Negative for hallucinations, self-injury and suicidal ideas.    ?There were no vitals taken for this visit.There is no height or weight on file to calculate BMI.  ?General Appearance: Well Groomed  ?Eye Contact:  Good  ?Speech:  Clear and Coherent  ?Volume:  Normal  ?Mood:  Euthymic  ?Affect:  Congruent  ?Thought Process:  Goal Directed  ?Orientation:  Full (Time, Place, and Person)  ?Thought Content: logical  ?Suicidal Thoughts:  No  ?Homicidal Thoughts:  No  ?Memory:  good  ?Judgement:  Good  ?Insight:  Good  ?Psychomotor Activity:  NA  ?Concentration:  good  ?Recall:  Good  ?Fund of Knowledge: Good  ?Language: Good  ?Akathisia:  NA  ?Handed:  Right  ?AIMS (if indicated): not done  ?Assets:  Communication Skills  ?ADL's:  Intact  ?Cognition: WNL  ?Sleep:  Good  ? ?Screenings: ?AUDIT   ? ?Flowsheet  Row [facility]  ?Alcohol Use Disorder Identification Test Final Score (AUDIT) 3  ? ?  ? ?GAD-7   ? ?Flowsheet Row [facility]  ?Total GAD-7 Score 21 19 20 19   ? ?  ? ?PHQ2-9   ? ?Flowsheet Row [facility] Counsel[facility]  ?PHQ-2 Total Score 2 3 4 5 2   ?PHQ-9 Total  Score 13 18 19 22 14   ? ?  ? ?Flowsheet Row ED from 10/07/2021 in [facility] Video Visit from 09/13/2021 in [facility] Office Visit from 06/21/2021 in [facility]  ?C-SSRS RISK CATEGORY No Risk Error: Q7 should not be populated when Q6 is No Error: Q7 should not be populated when Q6 is No  ? ?  ? ? ? ?Assessment and Plan:  Jose Stevenson is a 31 year old male presenting to [facility] behavioral health outpatient for follow-up psychiatric evaluation.  Patient is being seen by this provider today as his attending psychiatric provider is unavailable to complete today's appointment.  He has a psychiatric history of marijuana dependence, generalized anxiety disorder, borderline personality disorder, alcohol use disorder, and major depressive disorder.  His symptoms are managed with gabapentin 300 mg 3 times daily, hydroxyzine 25 mg 3 times daily as needed for anxiety, lithium 300 mg twice  daily and trazodone 50 mg at bedtime as needed for sleep.  Patient reports that he was previously taking Trintellix but he has not taken the medication in over 30 days due to medication side effects.  Patient stated "

## 2021-12-07 ENCOUNTER — Other Ambulatory Visit: Payer: Self-pay

## 2021-12-13 ENCOUNTER — Ambulatory Visit: Payer: BC Managed Care – PPO | Admitting: Nurse Practitioner

## 2021-12-16 ENCOUNTER — Encounter (HOSPITAL_COMMUNITY): Payer: Self-pay | Admitting: Radiology

## 2021-12-16 NOTE — Progress Notes (Unsigned)
Just an FYI. We have made several attempts to contact this patient including sending a letter to schedule or reschedule their echocardiogram. We will be removing the patient from the echo WQ.   Thank you   12/02/21 NO SHOWED -MAILED LETTER AND COPIED MD/LBW

## 2022-01-12 ENCOUNTER — Other Ambulatory Visit: Payer: Self-pay | Admitting: Internal Medicine

## 2022-01-12 ENCOUNTER — Other Ambulatory Visit: Payer: Self-pay

## 2022-01-12 DIAGNOSIS — F603 Borderline personality disorder: Secondary | ICD-10-CM

## 2022-01-12 DIAGNOSIS — F411 Generalized anxiety disorder: Secondary | ICD-10-CM

## 2022-01-12 MED ORDER — GABAPENTIN 300 MG PO CAPS: 300.0000 mg | ORAL_CAPSULE | Freq: Three times a day (TID) | ORAL | 3 refills | Status: DC

## 2022-01-12 MED ORDER — TRAZODONE HCL 50 MG PO TABS: ORAL_TABLET | Freq: Every evening | ORAL | 3 refills | Status: DC | PRN

## 2022-01-12 NOTE — Telephone Encounter (Signed)
Requested Prescriptions  Pending Prescriptions Disp Refills  . traZODone (DESYREL) 50 MG tablet 30 tablet 3    Sig: TAKE 1 TABLET (50 MG TOTAL) BY MOUTH AT BEDTIME AS NEEDED FOR SLEEP.     Psychiatry: Antidepressants - Serotonin Modulator Passed - 01/12/2022  4:38 PM      Passed - Completed PHQ-2 or PHQ-9 in the last 360 days      Passed - Valid encounter within last 6 months    Recent Outpatient Visits          1 month ago Establishing care with new doctor, encounter for   [place], MD      Future Appointments            In 1 month Wynetta Emery Dalbert Batman, MD [place]           . gabapentin (NEURONTIN) 300 MG capsule 90 capsule 3    Sig: Take 1 capsule (300 mg total) by mouth 3 (three) times daily.     Neurology: Anticonvulsants - gabapentin Passed - 01/12/2022  4:38 PM      Passed - Cr in normal range and within 360 days    Creatinine, Ser  Date Value Ref Range Status  11/18/2021 1.26 0.76 - 1.27 mg/dL Final         Passed - Completed PHQ-2 or PHQ-9 in the last 360 days      Passed - Valid encounter within last 12 months    Recent Outpatient Visits          1 month ago Establishing care with new doctor, encounter for   [place], MD      Future Appointments            In 1 month Wynetta Emery Dalbert Batman, MD [place]           . lithium carbonate (LITHOBID) 300 MG CR tablet 60 tablet 3    Sig: Take 1 tablet (300 mg total) by mouth 2 (two) times daily.     Not Delegated - Psychiatry:  Antimanics/Bipolar Agents Failed - 01/12/2022  4:38 PM      Failed - This refill cannot be delegated      Failed - Lithium Level in normal range and within 180 days    Lithium Lvl  Date Value Ref Range Status  03/17/2020 0.44 (L) 0.60 - 1.20 mmol/L Final    Comment:    Performed at [facility], [place]  [address], [place], [place] [address]         Failed - CBC within normal limits and completed in the last 6 months    WBC  Date Value Ref Range Status  11/18/2021 5.7 3.4 - 10.8 x10E3/uL Final  03/13/2020 5.1 4.0 - 10.5 K/uL Final   RBC  Date Value Ref Range Status  11/18/2021 4.91 4.14 - 5.80 x10E6/uL Final  03/13/2020 5.12 4.22 - 5.81 MIL/uL Final   Hemoglobin  Date Value Ref Range Status  11/18/2021 14.2 13.0 - 17.7 g/dL Final   Hematocrit  Date Value Ref Range Status  11/18/2021 42.2 37.5 - 51.0 % Final   MCHC  Date Value Ref Range Status  11/18/2021 33.6 31.5 - 35.7 g/dL Final  03/13/2020 31.6 30.0 - 36.0 g/dL Final   [facility]  Date Value Ref Range Status  11/18/2021 28.9 26.6 - 33.0 pg  Final  03/13/2020 27.5 26.0 - 34.0 pg Final   MCV  Date Value Ref Range Status  11/18/2021 86 79 - 97 fL Final   No results found for: "PLTCOUNTKUC", "LABPLAT", "POCPLA" RDW  Date Value Ref Range Status  11/18/2021 13.3 11.6 - 15.4 % Final         Failed - BMP within normal limits in the last 6 months    Glucose  Date Value Ref Range Status  11/18/2021 65 (L) 70 - 99 mg/dL Final   Glucose, Bld  Date Value Ref Range Status  03/17/2020 84 70 - 99 mg/dL Final    Comment:    Glucose reference range applies only to samples taken after fasting for at least 8 hours.   Calcium  Date Value Ref Range Status  11/18/2021 9.7 8.7 - 10.2 mg/dL Final   Sodium  Date Value Ref Range Status  11/18/2021 144 134 - 144 mmol/L Final   Potassium  Date Value Ref Range Status  11/18/2021 4.1 3.5 - 5.2 mmol/L Final   Chloride  Date Value Ref Range Status  11/18/2021 106 96 - 106 mmol/L Final   BUN  Date Value Ref Range Status  11/18/2021 13 6 - 20 mg/dL Final   Creatinine, Ser  Date Value Ref Range Status  11/18/2021 1.26 0.76 - 1.27 mg/dL Final   CO2  Date Value Ref Range Status  11/18/2021 26 20 - 29 mmol/L Final   GFR calc Af Amer  Date Value Ref Range Status  03/17/2020 >60  >60 mL/min Final   eGFR  Date Value Ref Range Status  11/18/2021 80 >59 mL/min/1.73 Final   GFR calc non Af Amer  Date Value Ref Range Status  03/17/2020 >60 >60 mL/min Final         Passed - TSH in normal range and within 180 days    TSH  Date Value Ref Range Status  11/18/2021 2.080 0.450 - 4.500 uIU/mL Final         Passed - eGFR is 30 or above and within 180 days    GFR calc Af Amer  Date Value Ref Range Status  03/17/2020 >60 >60 mL/min Final   GFR calc non Af Amer  Date Value Ref Range Status  03/17/2020 >60 >60 mL/min Final   eGFR  Date Value Ref Range Status  11/18/2021 80 >59 mL/min/1.73 Final         Passed - Patient is not pregnant      Passed - Valid encounter within last 6 months    Recent Outpatient Visits          1 month ago Establishing care with new doctor, encounter for   [place], MD      Future Appointments            In 1 month Ladell Pier, MD [place]

## 2022-01-12 NOTE — Telephone Encounter (Signed)
Requested medication (s) are due for refill today: yes  Requested medication (s) are on the active medication list: yes  Last refill:  09/13/21 #60/3  Future visit scheduled: yes  Notes to clinic:  Unable to refill per protocol, cannot delegate.    Requested Prescriptions  Pending Prescriptions Disp Refills   lithium carbonate (LITHOBID) 300 MG CR tablet 60 tablet 3    Sig: Take 1 tablet (300 mg total) by mouth 2 (two) times daily.     Not Delegated - Psychiatry:  Antimanics/Bipolar Agents Failed - 01/12/2022  4:38 PM      Failed - This refill cannot be delegated      Failed - Lithium Level in normal range and within 180 days    Lithium Lvl  Date Value Ref Range Status  03/17/2020 0.44 (L) 0.60 - 1.20 mmol/L Final    Comment:    Performed at [facility], [place] [address] [place], [place] [address]         Failed - CBC within normal limits and completed in the last 6 months    WBC  Date Value Ref Range Status  11/18/2021 5.7 3.4 - 10.8 x10E3/uL Final  03/13/2020 5.1 4.0 - 10.5 K/uL Final   RBC  Date Value Ref Range Status  11/18/2021 4.91 4.14 - 5.80 x10E6/uL Final  03/13/2020 5.12 4.22 - 5.81 MIL/uL Final   Hemoglobin  Date Value Ref Range Status  11/18/2021 14.2 13.0 - 17.7 g/dL Final   Hematocrit  Date Value Ref Range Status  11/18/2021 42.2 37.5 - 51.0 % Final   MCHC  Date Value Ref Range Status  11/18/2021 33.6 31.5 - 35.7 g/dL Final  03/13/2020 31.6 30.0 - 36.0 g/dL Final   [facility]  Date Value Ref Range Status  11/18/2021 28.9 26.6 - 33.0 pg Final  03/13/2020 27.5 26.0 - 34.0 pg Final   MCV  Date Value Ref Range Status  11/18/2021 86 79 - 97 fL Final   No results found for: "PLTCOUNTKUC", "LABPLAT", "POCPLA" RDW  Date Value Ref Range Status  11/18/2021 13.3 11.6 - 15.4 % Final         Failed - BMP within normal limits in the last 6 months    Glucose  Date Value Ref Range Status  11/18/2021 65 (L) 70 - 99 mg/dL Final   Glucose,  Bld  Date Value Ref Range Status  03/17/2020 84 70 - 99 mg/dL Final    Comment:    Glucose reference range applies only to samples taken after fasting for at least 8 hours.   Calcium  Date Value Ref Range Status  11/18/2021 9.7 8.7 - 10.2 mg/dL Final   Sodium  Date Value Ref Range Status  11/18/2021 144 134 - 144 mmol/L Final   Potassium  Date Value Ref Range Status  11/18/2021 4.1 3.5 - 5.2 mmol/L Final   Chloride  Date Value Ref Range Status  11/18/2021 106 96 - 106 mmol/L Final   BUN  Date Value Ref Range Status  11/18/2021 13 6 - 20 mg/dL Final   Creatinine, Ser  Date Value Ref Range Status  11/18/2021 1.26 0.76 - 1.27 mg/dL Final   CO2  Date Value Ref Range Status  11/18/2021 26 20 - 29 mmol/L Final   GFR calc Af Amer  Date Value Ref Range Status  03/17/2020 >60 >60 mL/min Final   eGFR  Date Value Ref Range Status  11/18/2021 80 >59 mL/min/1.73 Final   GFR calc non  Af Amer  Date Value Ref Range Status  03/17/2020 >60 >60 mL/min Final         Passed - TSH in normal range and within 180 days    TSH  Date Value Ref Range Status  11/18/2021 2.080 0.450 - 4.500 uIU/mL Final         Passed - eGFR is 30 or above and within 180 days    GFR calc Af Amer  Date Value Ref Range Status  03/17/2020 >60 >60 mL/min Final   GFR calc non Af Amer  Date Value Ref Range Status  03/17/2020 >60 >60 mL/min Final   eGFR  Date Value Ref Range Status  11/18/2021 80 >59 mL/min/1.73 Final         Passed - Patient is not pregnant      Passed - Valid encounter within last 6 months    Recent Outpatient Visits           1 month ago Establishing care with new doctor, encounter for   [place], MD       Future Appointments             In 1 month Ladell Pier, MD [place]            Signed Prescriptions Disp Refills   traZODone (DESYREL) 50 MG tablet 30 tablet 3     Sig: TAKE 1 TABLET (50 MG TOTAL) BY MOUTH AT BEDTIME AS NEEDED FOR SLEEP.     Psychiatry: Antidepressants - Serotonin Modulator Passed - 01/12/2022  4:38 PM      Passed - Completed PHQ-2 or PHQ-9 in the last 360 days      Passed - Valid encounter within last 6 months    Recent Outpatient Visits           1 month ago Establishing care with new doctor, encounter for   [place], MD       Future Appointments             In 1 month Ladell Pier, MD [place]             gabapentin (NEURONTIN) 300 MG capsule 90 capsule 3    Sig: Take 1 capsule (300 mg total) by mouth 3 (three) times daily.     Neurology: Anticonvulsants - gabapentin Passed - 01/12/2022  4:38 PM      Passed - Cr in normal range and within 360 days    Creatinine, Ser  Date Value Ref Range Status  11/18/2021 1.26 0.76 - 1.27 mg/dL Final         Passed - Completed PHQ-2 or PHQ-9 in the last 360 days      Passed - Valid encounter within last 12 months    Recent Outpatient Visits           1 month ago Establishing care with new doctor, encounter for   [place], MD       Future Appointments             In 1 month Wynetta Emery Dalbert Batman, MD [place]

## 2022-01-12 NOTE — Telephone Encounter (Unsigned)
Copied from CRM [id number]. Topic: General - Other >> Jan 12, 2022  2:26 PM Everette C wrote: Reason for CRM: Medication Refill - Medication: Rx #: [id number]  traZODone (DESYREL) 50 MG tablet [[id number]]  Rx #: [id number]  gabapentin (NEURONTIN) 300 MG capsule [[id number]]   Rx #: [id number]  lithium carbonate (LITHOBID) 300 MG CR tablet [[id number]]   Has the patient contacted their pharmacy? Yes.  The patient's pharmacy has made contact on their behalf  (Agent: If no, request that the patient contact the pharmacy for the refill. If patient does not wish to contact the pharmacy document the reason why and proceed with request.) (Agent: If yes, when and what did the pharmacy advise?)  Preferred Pharmacy (with phone number or street name): [id number] - [place], [place] - [address] [place] [address] Phone: [phone] Fax: [phone] Hours: Open 24 hours   Has the patient been seen for an appointment in the last year OR does the patient have an upcoming appointment? Yes.    Agent: Please be advised that RX refills may take up to 3 business days. We ask that you follow-up with your pharmacy.

## 2022-01-13 ENCOUNTER — Other Ambulatory Visit: Payer: Self-pay | Admitting: Internal Medicine

## 2022-01-13 DIAGNOSIS — F603 Borderline personality disorder: Secondary | ICD-10-CM

## 2022-01-13 NOTE — Telephone Encounter (Signed)
Requested medication (s) are due for refill today - yes  Requested medication (s) are on the active medication list -yes  Future visit scheduled -yes  Last refill: 09/13/21 #60 3RF  Notes to clinic: non delegated Rx, fails lab protocol  Requested Prescriptions  Pending Prescriptions Disp Refills   lithium carbonate (LITHOBID) 300 MG CR tablet 60 tablet 3    Sig: Take 1 tablet (300 mg total) by mouth 2 (two) times daily.     Not Delegated - Psychiatry:  Antimanics/Bipolar Agents Failed - 01/13/2022  9:13 AM      Failed - This refill cannot be delegated      Failed - Lithium Level in normal range and within 180 days    Lithium Lvl  Date Value Ref Range Status  03/17/2020 0.44 (L) 0.60 - 1.20 mmol/L Final    Comment:    Performed at [facility], [address], [place], [place] [address]         Failed - CBC within normal limits and completed in the last 6 months    WBC  Date Value Ref Range Status  11/18/2021 5.7 3.4 - 10.8 x10E3/uL Final  03/13/2020 5.1 4.0 - 10.5 K/uL Final   RBC  Date Value Ref Range Status  11/18/2021 4.91 4.14 - 5.80 x10E6/uL Final  03/13/2020 5.12 4.22 - 5.81 MIL/uL Final   Hemoglobin  Date Value Ref Range Status  11/18/2021 14.2 13.0 - 17.7 g/dL Final   Hematocrit  Date Value Ref Range Status  11/18/2021 42.2 37.5 - 51.0 % Final   MCHC  Date Value Ref Range Status  11/18/2021 33.6 31.5 - 35.7 g/dL Final  19/14/7829 56.2 30.0 - 36.0 g/dL Final   [facility]  Date Value Ref Range Status  11/18/2021 28.9 26.6 - 33.0 pg Final  03/13/2020 27.5 26.0 - 34.0 pg Final   MCV  Date Value Ref Range Status  11/18/2021 86 79 - 97 fL Final   No results found for: "PLTCOUNTKUC", "LABPLAT", "POCPLA" RDW  Date Value Ref Range Status  11/18/2021 13.3 11.6 - 15.4 % Final         Failed - BMP within normal limits in the last 6 months    Glucose  Date Value Ref Range Status  11/18/2021 65 (L) 70 - 99 mg/dL Final   Glucose, Bld  Date  Value Ref Range Status  03/17/2020 84 70 - 99 mg/dL Final    Comment:    Glucose reference range applies only to samples taken after fasting for at least 8 hours.   Calcium  Date Value Ref Range Status  11/18/2021 9.7 8.7 - 10.2 mg/dL Final   Sodium  Date Value Ref Range Status  11/18/2021 144 134 - 144 mmol/L Final   Potassium  Date Value Ref Range Status  11/18/2021 4.1 3.5 - 5.2 mmol/L Final   Chloride  Date Value Ref Range Status  11/18/2021 106 96 - 106 mmol/L Final   BUN  Date Value Ref Range Status  11/18/2021 13 6 - 20 mg/dL Final   Creatinine, Ser  Date Value Ref Range Status  11/18/2021 1.26 0.76 - 1.27 mg/dL Final   CO2  Date Value Ref Range Status  11/18/2021 26 20 - 29 mmol/L Final   GFR calc Af Amer  Date Value Ref Range Status  03/17/2020 >60 >60 mL/min Final   eGFR  Date Value Ref Range Status  11/18/2021 80 >59 mL/min/1.73 Final   GFR calc non Af Denyse Dago  Date Value Ref Range Status  03/17/2020 >60 >60 mL/min Final         Passed - TSH in normal range and within 180 days    TSH  Date Value Ref Range Status  11/18/2021 2.080 0.450 - 4.500 uIU/mL Final         Passed - eGFR is 30 or above and within 180 days    GFR calc Af Amer  Date Value Ref Range Status  03/17/2020 >60 >60 mL/min Final   GFR calc non Af Amer  Date Value Ref Range Status  03/17/2020 >60 >60 mL/min Final   eGFR  Date Value Ref Range Status  11/18/2021 80 >59 mL/min/1.73 Final         Passed - Patient is not pregnant      Passed - Valid encounter within last 6 months    Recent Outpatient Visits           1 month ago Establishing care with new doctor, encounter for   [facility] Marcine Matar, MD       Future Appointments             In 1 month Marcine Matar, MD [facility]               Requested Prescriptions  Pending Prescriptions Disp Refills   lithium carbonate (LITHOBID)  300 MG CR tablet 60 tablet 3    Sig: Take 1 tablet (300 mg total) by mouth 2 (two) times daily.     Not Delegated - Psychiatry:  Antimanics/Bipolar Agents Failed - 01/13/2022  9:13 AM      Failed - This refill cannot be delegated      Failed - Lithium Level in normal range and within 180 days    Lithium Lvl  Date Value Ref Range Status  03/17/2020 0.44 (L) 0.60 - 1.20 mmol/L Final    Comment:    Performed at [facility], [address] [place], [place] [address]         Failed - CBC within normal limits and completed in the last 6 months    WBC  Date Value Ref Range Status  11/18/2021 5.7 3.4 - 10.8 x10E3/uL Final  03/13/2020 5.1 4.0 - 10.5 K/uL Final   RBC  Date Value Ref Range Status  11/18/2021 4.91 4.14 - 5.80 x10E6/uL Final  03/13/2020 5.12 4.22 - 5.81 MIL/uL Final   Hemoglobin  Date Value Ref Range Status  11/18/2021 14.2 13.0 - 17.7 g/dL Final   Hematocrit  Date Value Ref Range Status  11/18/2021 42.2 37.5 - 51.0 % Final   MCHC  Date Value Ref Range Status  11/18/2021 33.6 31.5 - 35.7 g/dL Final  78/29/5621 30.8 30.0 - 36.0 g/dL Final   [facility]  Date Value Ref Range Status  11/18/2021 28.9 26.6 - 33.0 pg Final  03/13/2020 27.5 26.0 - 34.0 pg Final   MCV  Date Value Ref Range Status  11/18/2021 86 79 - 97 fL Final   No results found for: "PLTCOUNTKUC", "LABPLAT", "POCPLA" RDW  Date Value Ref Range Status  11/18/2021 13.3 11.6 - 15.4 % Final         Failed - BMP within normal limits in the last 6 months    Glucose  Date Value Ref Range Status  11/18/2021 65 (L) 70 - 99 mg/dL Final   Glucose, Bld  Date Value Ref Range Status  03/17/2020 84  70 - 99 mg/dL Final    Comment:    Glucose reference range applies only to samples taken after fasting for at least 8 hours.   Calcium  Date Value Ref Range Status  11/18/2021 9.7 8.7 - 10.2 mg/dL Final   Sodium  Date Value Ref Range Status  11/18/2021 144 134 - 144 mmol/L Final    Potassium  Date Value Ref Range Status  11/18/2021 4.1 3.5 - 5.2 mmol/L Final   Chloride  Date Value Ref Range Status  11/18/2021 106 96 - 106 mmol/L Final   BUN  Date Value Ref Range Status  11/18/2021 13 6 - 20 mg/dL Final   Creatinine, Ser  Date Value Ref Range Status  11/18/2021 1.26 0.76 - 1.27 mg/dL Final   CO2  Date Value Ref Range Status  11/18/2021 26 20 - 29 mmol/L Final   GFR calc Af Amer  Date Value Ref Range Status  03/17/2020 >60 >60 mL/min Final   eGFR  Date Value Ref Range Status  11/18/2021 80 >59 mL/min/1.73 Final   GFR calc non Af Amer  Date Value Ref Range Status  03/17/2020 >60 >60 mL/min Final         Passed - TSH in normal range and within 180 days    TSH  Date Value Ref Range Status  11/18/2021 2.080 0.450 - 4.500 uIU/mL Final         Passed - eGFR is 30 or above and within 180 days    GFR calc Af Amer  Date Value Ref Range Status  03/17/2020 >60 >60 mL/min Final   GFR calc non Af Amer  Date Value Ref Range Status  03/17/2020 >60 >60 mL/min Final   eGFR  Date Value Ref Range Status  11/18/2021 80 >59 mL/min/1.73 Final         Passed - Patient is not pregnant      Passed - Valid encounter within last 6 months    Recent Outpatient Visits           1 month ago Establishing care with new doctor, encounter for   [facility] Marcine Matar, MD       Future Appointments             In 1 month Marcine Matar, MD [facility]

## 2022-01-16 ENCOUNTER — Other Ambulatory Visit (HOSPITAL_COMMUNITY): Payer: Self-pay | Admitting: Psychiatry

## 2022-01-16 ENCOUNTER — Other Ambulatory Visit (HOSPITAL_COMMUNITY): Payer: Self-pay

## 2022-01-16 ENCOUNTER — Telehealth (HOSPITAL_COMMUNITY): Payer: Self-pay | Admitting: *Deleted

## 2022-01-16 ENCOUNTER — Other Ambulatory Visit: Payer: Self-pay

## 2022-01-16 DIAGNOSIS — F411 Generalized anxiety disorder: Secondary | ICD-10-CM

## 2022-01-16 DIAGNOSIS — F603 Borderline personality disorder: Secondary | ICD-10-CM

## 2022-01-16 MED ORDER — TRAZODONE HCL 50 MG PO TABS
ORAL_TABLET | Freq: Every evening | ORAL | 3 refills | Status: DC | PRN
  Filled 2022-01-16 (×2): qty 30, 30d supply, fill #0

## 2022-01-16 MED ORDER — HYDROXYZINE HCL 25 MG PO TABS
25.0000 mg | ORAL_TABLET | Freq: Three times a day (TID) | ORAL | 3 refills | Status: DC | PRN
  Filled 2022-01-16: qty 90, 30d supply, fill #0

## 2022-01-16 MED ORDER — LITHIUM CARBONATE ER 300 MG PO TBCR
300.0000 mg | EXTENDED_RELEASE_TABLET | Freq: Two times a day (BID) | ORAL | 3 refills | Status: DC
  Filled 2022-01-16: qty 60, 30d supply, fill #0

## 2022-01-16 MED ORDER — GABAPENTIN 300 MG PO CAPS
300.0000 mg | ORAL_CAPSULE | Freq: Three times a day (TID) | ORAL | 3 refills | Status: DC
Start: 2022-01-16 — End: 2022-03-08
  Filled 2022-01-16: qty 90, 30d supply, fill #0

## 2022-01-16 NOTE — Telephone Encounter (Signed)
Patient called stated he called his Rx to get refills on his medications & was told that provider would need to approve his refills. Patient asked if we could send message to you because "he needs his refills"

## 2022-01-30 ENCOUNTER — Other Ambulatory Visit: Payer: Self-pay

## 2022-02-21 ENCOUNTER — Ambulatory Visit: Payer: BC Managed Care – PPO | Admitting: Internal Medicine

## 2022-03-08 ENCOUNTER — Telehealth (INDEPENDENT_AMBULATORY_CARE_PROVIDER_SITE_OTHER): Payer: No Payment, Other | Admitting: Psychiatry

## 2022-03-08 ENCOUNTER — Encounter (HOSPITAL_COMMUNITY): Payer: Self-pay | Admitting: Psychiatry

## 2022-03-08 DIAGNOSIS — F411 Generalized anxiety disorder: Secondary | ICD-10-CM | POA: Diagnosis not present

## 2022-03-08 DIAGNOSIS — F603 Borderline personality disorder: Secondary | ICD-10-CM | POA: Diagnosis not present

## 2022-03-08 MED ORDER — TRAZODONE HCL 50 MG PO TABS: ORAL_TABLET | Freq: Every evening | ORAL | 4 refills | Status: AC | PRN

## 2022-03-08 MED ORDER — LITHIUM CARBONATE ER 300 MG PO TBCR: 300.0000 mg | EXTENDED_RELEASE_TABLET | Freq: Two times a day (BID) | ORAL | 4 refills | Status: AC

## 2022-03-08 MED ORDER — HYDROXYZINE HCL 25 MG PO TABS: 25.0000 mg | ORAL_TABLET | Freq: Three times a day (TID) | ORAL | 4 refills | Status: AC | PRN

## 2022-03-08 NOTE — Progress Notes (Addendum)
Virtual Visit via Telephone Note  I connected with [place] on 06/05/22 at 11:30 AM EDT by telephone and verified that I am speaking with the correct person using two identifiers.  Location: Patient: home Provider: Clinic   I discussed the limitations, risks, security and privacy concerns of performing an evaluation and management service by telephone and the availability of in person appointments. I also discussed with the patient that there may be a patient responsible charge related to this service. The patient expressed understanding and agreed to proceed.   I provided 30 minutes of non-face-to-face time during this encounter.     03/08/2022 12:29 PM [place]  MRN:  [id number]  Chief Complaint: "I have been off medication for a month" massa   HPI: 31 year old male seen today for follow up psychiatric evaluation.   He has a psychiatric history of anxiety, depression, borderline personality, and marijuana dependence.  He is currently managed on lithium 300 mg twice daily,  trazodone 50 mg nightly, gabapentin 300 mg three times daily, hydroxyzine 25 mg 3 times daily,   He notes that he disliked gabapentin and has not had his other medications in over a month.    Today was unable to login virtually so his assessment was done over the phone. During exam he was  pleasant, cooperative, and engaged in conversation.  He informed Clinical research associate that he has been off of his medications for a month and feels mentally unstable. He reports that he is now living in [place]. He notes that he left [place] and his job at [employer] impulsively. He now works at [employer] and notes that his mental health is jeopardizing his work. He notes that he has been easily distracted, forgetting task, and irritable. Patient notes that since being off of medications his mood has been fluctuating. He reports that he is insecure of himself and never feels that he is enough. He reports that he is still dating his boyfriend but  notes that his boyfriend not receptive to his hypersexual  behavior.  Patient notes that he above has exacerbates his anxiety and depression. Today provider conducted a GAD 7 and patient scored a 20. Provider also conducted a PHQ 9 and patient scored a 19. He notes that his sleep fluctuates. He endorses having an adequate appetite. Today he endorses passive SI but denies wanting to harm himself today. Today he denies SI/HI/VAH or paranoia.   Today patient reports that he does not wanting to restart gabapentin as he reports that it caused him to feel aggressive. He is also agreeable to restarting Lithium 300 mg twice daily to help manage mood. He will restart hydroxyzine 25 mg to help manage anxiety. Patient informed that he would have to find a psychiatric provider in [place]. He endorsed understanding and agreed.   No other concerns at this time.     Visit Diagnosis:    ICD-10-CM   1. Borderline personality disorder (HCC)  F60.3 lithium carbonate (LITHOBID) 300 MG CR tablet    traZODone (DESYREL) 50 MG tablet    2. Generalized anxiety disorder  F41.1 hydrOXYzine (ATARAX) 25 MG tablet      Past Psychiatric History: Anxiety, depression, borderline personality, and marijuana dependence.  Past Medical History:  Past Medical History:  Diagnosis Date   Anxiety    Depression    PTSD (post-traumatic stress disorder)     Past Surgical History:  Procedure Laterality Date   NO PAST SURGERIES      Family Psychiatric History: Biological mother bipolar,  Biological father bipolar disorder, paternal grandmother bipolar  Family History:  Family History  Adopted: Yes  Family history unknown: Yes    Social History:  Social History   Socioeconomic History   Marital status: Single    Spouse name: Not on file   Number of children: 0   Years of education: Not on file   Highest education level: Not on file  Occupational History   Occupation: Risk manager: [employer]   Tobacco Use   Smoking status: Never   Smokeless tobacco: Never  Substance and Sexual Activity   Alcohol use: Yes    Comment: 1 bottle of champagne daily   Drug use: Yes    Frequency: 3.0 times per week    Types: Marijuana   Sexual activity: Yes  Other Topics Concern   Not on file  Social History Narrative   Not on file   Social Determinants of Health   Financial Resource Strain: Low Risk  (03/25/2020)   Overall Financial Resource Strain (CARDIA)    Difficulty of Paying Living Expenses: Not hard at all  Food Insecurity: No Food Insecurity (03/25/2020)   Hunger Vital Sign    Worried About Running Out of Food in the Last Year: Never true    Ran Out of Food in the Last Year: Never true  Transportation Needs: No Transportation Needs (03/25/2020)   PRAPARE - Administrator, Civil Service (Medical): No    Lack of Transportation (Non-Medical): No  Physical Activity: Sufficiently Active (03/25/2020)   Exercise Vital Sign    Days of Exercise per Week: 4 days    Minutes of Exercise per Session: 60 min  Stress: Stress Concern Present (03/25/2020)   [employer] of Occupational Health - Occupational Stress Questionnaire    Feeling of Stress : Very much  Social Connections: Socially Isolated (03/25/2020)   Social Connection and Isolation Panel [NHANES]    Frequency of Communication with Friends and Family: More than three times a week    Frequency of Social Gatherings with Friends and Family: Once a week    Attends Religious Services: Never    Database administrator or Organizations: No    Attends Engineer, structural: Never    Marital Status: Never married    Allergies: No Known Allergies  Metabolic Disorder Labs: Lab Results  Component Value Date   HGBA1C 4.7 (L) 03/13/2020   MPG 88.19 03/13/2020   No results found for: "PROLACTIN" Lab Results  Component Value Date   CHOL 153 11/18/2021   TRIG 82 11/18/2021   HDL 53 11/18/2021   CHOLHDL 2.9 11/18/2021    VLDL 15 03/13/2020   LDLCALC 84 11/18/2021   LDLCALC 116 (H) 03/13/2020   Lab Results  Component Value Date   TSH 2.080 11/18/2021   TSH 2.732 03/17/2020    Therapeutic Level Labs: Lab Results  Component Value Date   LITHIUM 0.44 (L) 03/17/2020   LITHIUM 0.56 (L) 03/13/2020   No results found for: "VALPROATE" No results found for: "CBMZ"  Current Medications: Current Outpatient Medications  Medication Sig Dispense Refill   hydrOXYzine (ATARAX) 25 MG tablet Take 1 tablet (25 mg total) by mouth 3 (three) times daily as needed. 90 tablet 4   lithium carbonate (LITHOBID) 300 MG CR tablet Take 1 tablet (300 mg total) by mouth 2 (two) times daily. 60 tablet 4   traZODone (DESYREL) 50 MG tablet TAKE 1 TABLET (50 MG TOTAL) BY MOUTH AT BEDTIME AS  NEEDED FOR SLEEP. 30 tablet 4   No current facility-administered medications for this visit.     Musculoskeletal: Strength & Muscle Tone:  Unable to assess due to telephone visit ,  Gait & Station:  Unable to assess due to telephone visit ,  Patient leans: N/A  Psychiatric Specialty Exam: Review of Systems  There were no vitals taken for this visit.There is no height or weight on file to calculate BMI.  General Appearance:  Unable to assess due to telephone visit  Eye Contact:   Unable to assess due to telephone visit  Speech:  Clear and Coherent and Normal Rate  Volume:  Normal  Mood:  Anxious and Depressed  Affect:  Appropriate and Congruent  Thought Process:  Coherent, Goal Directed and Linear  Orientation:  Full (Time, Place, and Person)  Thought Content: WDL and Logical   Suicidal Thoughts:  No  Homicidal Thoughts:  No  Memory:  Immediate;   Good Recent;   Good Remote;   Good  Judgement:  Fair  Insight:  Good  Psychomotor Activity:   Unable to assess due to telephone visit  Concentration:  Concentration: Good and Attention Span: Good  Recall:  Good  Fund of Knowledge: Good  Language: Good  Akathisia:  Unable to assess  due to telephone visit  Handed:  Right  AIMS (if indicated): Not done  Assets:  Communication Skills Desire for Improvement Financial Resources/Insurance Housing Intimacy Social Support  ADL's:  Intact  Cognition: WNL  Sleep:  Fair   Screenings: AUDIT    Flowsheet Row [facility]  Alcohol Use Disorder Identification Test Final Score (AUDIT) 3      GAD-7    Flowsheet Row Video Visit from 03/08/2022 in [facility] Office Visit from 11/18/2021 in [facility] Video Visit from 09/13/2021 in [facility] Office Visit from 06/21/2021 in [facility] Clinical Support from 06/10/2020 in [facility]  Total GAD-7 Score 20 21 19 20 19       PHQ2-9    Flowsheet Row Video Visit from 03/08/2022 in [facility] Office Visit from 11/18/2021 in [facility] Video Visit from 09/13/2021 in [facility] Office Visit from 06/21/2021 in [facility] Counselor from 06/10/2021 in [facility]  PHQ-2 Total Score 5 2 3 4 5   PHQ-9 Total Score 19 13 18 19 22       Flowsheet Row Video Visit from 03/08/2022 in [facility] ED from 10/07/2021 in [facility] Video Visit from 09/13/2021 in [facility]  C-SSRS RISK CATEGORY Error: Q7 should not be populated when Q6 is No No Risk Error: Q7 should not be populated when Q6 is No        Assessment and Plan: Patient endorses symptoms of BPD, anxiety and depression.  Today patient reports that he does not wanting to restart gabapentin as he reports that it caused him to feel aggressive. He is also agreeable to restarting Lithium 300 mg  twice daily to help manage mood. He will restart hydroxyzine 25 mg to help manage anxiety. Patient informed that he would have to find a psychiatric provider in 10/09/2021. He endorsed understanding and agreed.  1. Borderline personality disorder (HCC)  Restart- lithium carbonate (LITHOBID) 300 MG CR tablet;  Take 1 tablet (300 mg total) by mouth 2 (two) times daily.  Dispense: 60 tablet; Refill: 4 Restart- traZODone (DESYREL) 50 MG tablet; TAKE 1 TABLET (50 MG TOTAL) BY MOUTH AT BEDTIME AS NEEDED FOR SLEEP.  Dispense: 30 tablet; Refill: 4  2. Generalized anxiety disorder  Restart- hydrOXYzine (ATARAX) 25 MG tablet; Take 1 tablet (25 mg total) by mouth 3 (three) times daily as needed.  Dispense: 90 tablet; Refill: 4  Follow-up as needed  Shanna Cisco, NP 03/08/2022, 12:29 PM

## 2022-03-14 ENCOUNTER — Telehealth (HOSPITAL_COMMUNITY): Payer: Self-pay | Admitting: *Deleted

## 2022-03-14 NOTE — Telephone Encounter (Signed)
Patient Called Requested Letter of Accomodation to be e-mailed to [email or web address] 

## 2022-03-14 NOTE — Telephone Encounter (Signed)
Patient Called Requested Letter of Accomodation to be e-mailed to [email or web address]

## 2022-03-15 NOTE — Telephone Encounter (Signed)
Writer wrote a letter of on 03/08/2022.  Provider informed patient of this.  Provider will have front desk staff to email letter to patient at his request.

## 2022-05-11 ENCOUNTER — Other Ambulatory Visit: Payer: Self-pay

## 2022-12-01 IMAGING — DX DG ABDOMEN ACUTE W/ 1V CHEST
4 series · 4 of 4 positions shown · non-contrast
Comparison: None Available.

CLINICAL DATA: Abdominal pain

EXAM:
DG ABDOMEN ACUTE WITH 1 VIEW CHEST

[abdomen kub (1 of 3)]
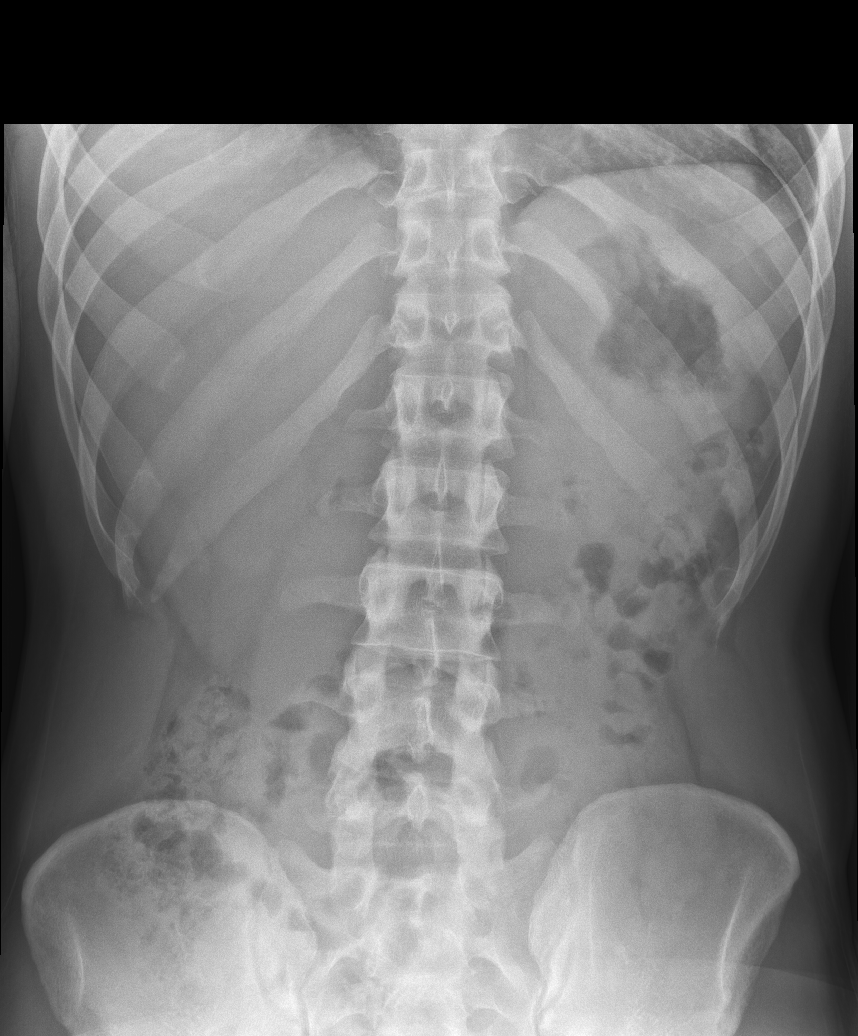

[chest pa]
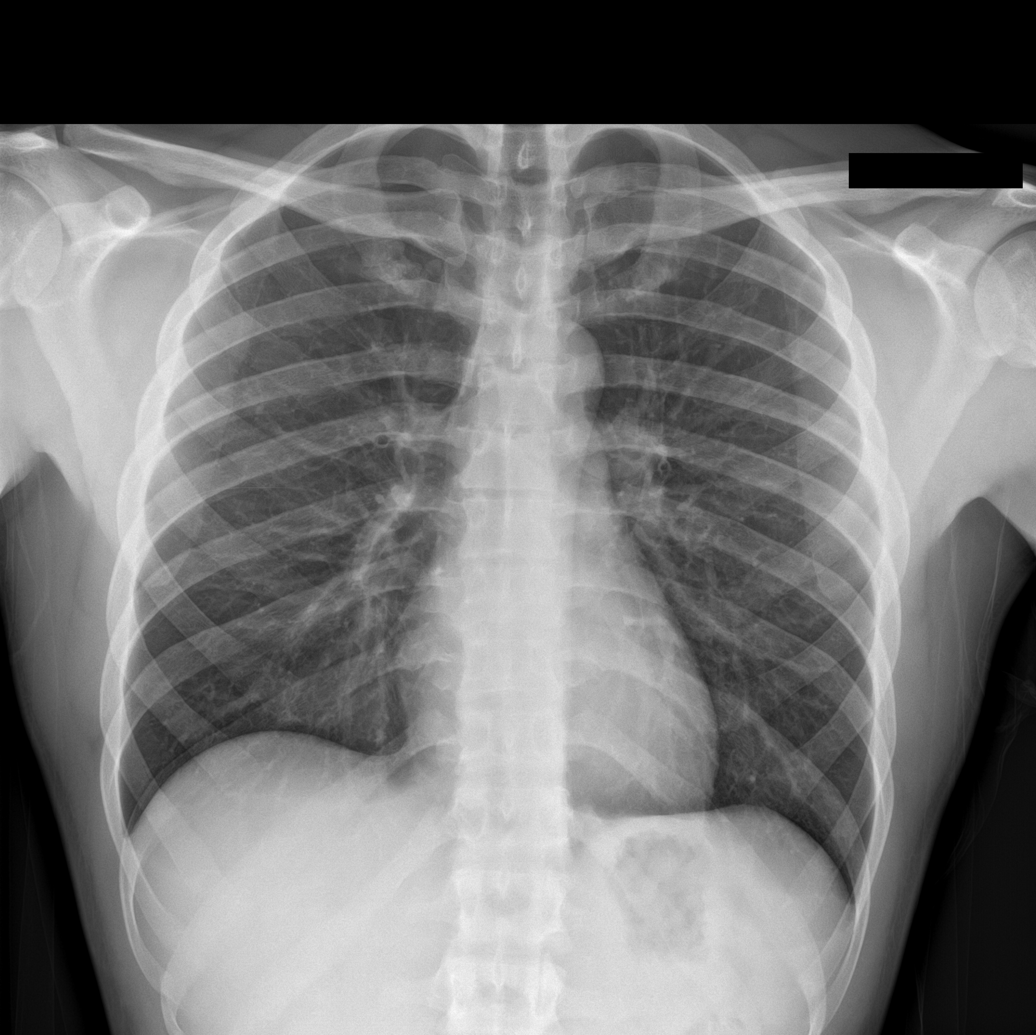

[abdomen kub (2 of 3)]
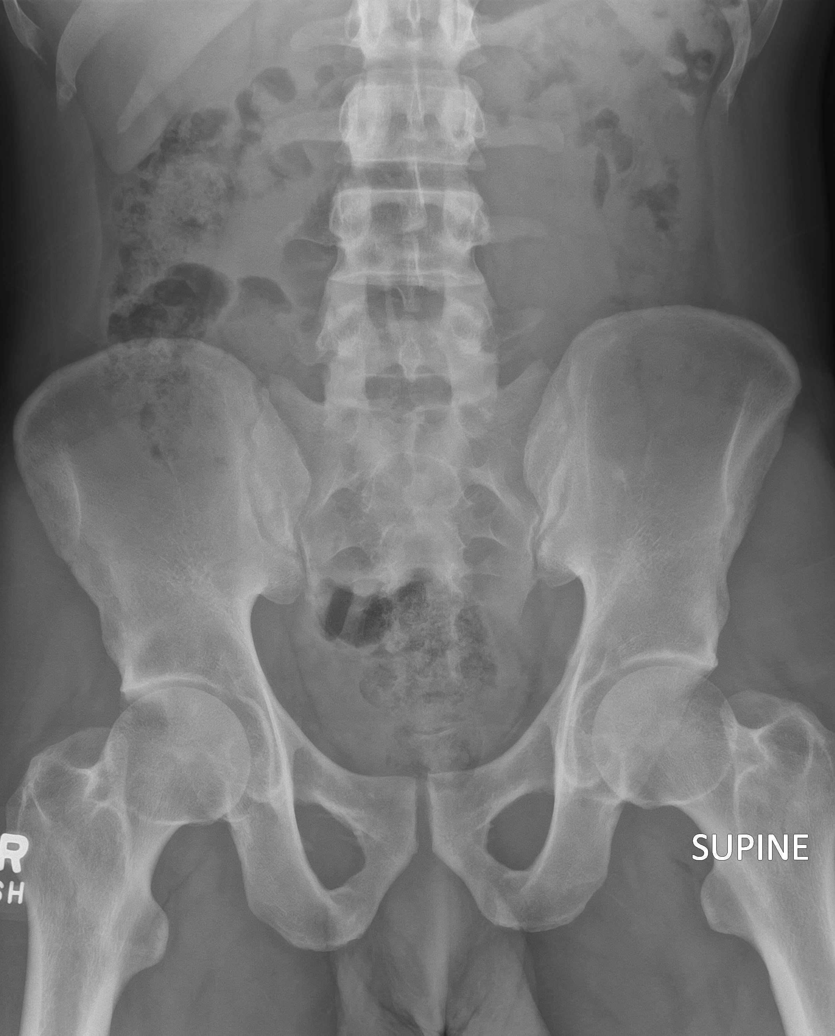

[abdomen kub (3 of 3)]
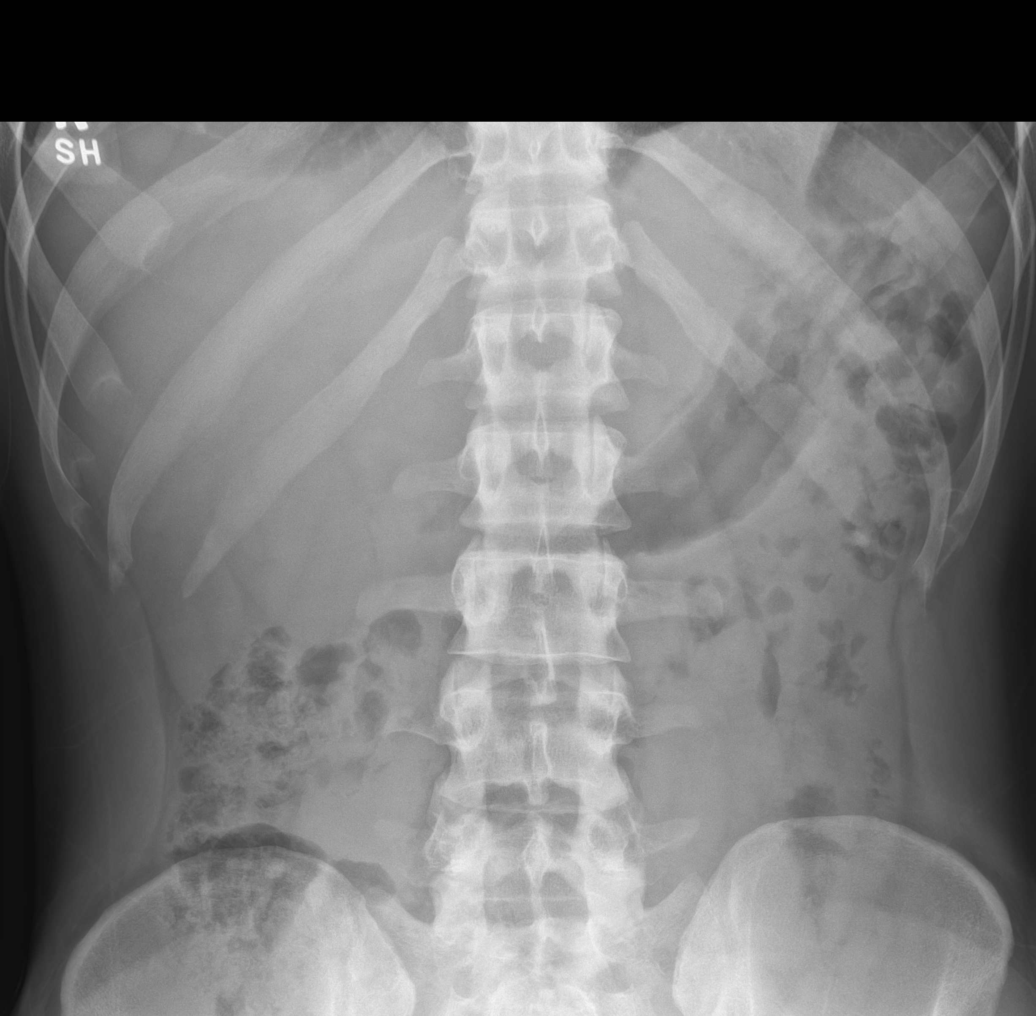

[4 of 4 positions shown; findings below may reference images not displayed]

FINDINGS: There is no evidence of dilated bowel loops or free intraperitoneal
air. No radiopaque calculi or other significant radiographic
abnormality is seen. Heart size and mediastinal contours are within
normal limits. Both lungs are clear.
IMPRESSION: Negative abdominal radiographs.  No acute cardiopulmonary disease.
